# Patient Record
Sex: Male | Born: 1965 | Race: White | Hispanic: No | Smoking: Never smoker
Health system: Southern US, Community
[De-identification: ages and names within clinical notes are randomized; demographics above are authoritative.]

## PROBLEM LIST (undated history)

## (undated) DIAGNOSIS — I10 Essential (primary) hypertension: Secondary | ICD-10-CM

## (undated) DIAGNOSIS — R079 Chest pain, unspecified: Secondary | ICD-10-CM

## (undated) DIAGNOSIS — N200 Calculus of kidney: Secondary | ICD-10-CM

## (undated) DIAGNOSIS — C801 Malignant (primary) neoplasm, unspecified: Secondary | ICD-10-CM

## (undated) DIAGNOSIS — K5792 Diverticulitis of intestine, part unspecified, without perforation or abscess without bleeding: Secondary | ICD-10-CM

## (undated) DIAGNOSIS — K219 Gastro-esophageal reflux disease without esophagitis: Secondary | ICD-10-CM

## (undated) HISTORY — PX: OTHER SURGICAL HISTORY: SHX169

## (undated) HISTORY — PX: KNEE ARTHROSCOPY: SHX127

---

## 2006-09-08 DIAGNOSIS — C801 Malignant (primary) neoplasm, unspecified: Secondary | ICD-10-CM

## 2006-09-08 HISTORY — DX: Malignant (primary) neoplasm, unspecified: C80.1

## 2011-04-11 ENCOUNTER — Encounter: Payer: Self-pay | Admitting: *Deleted

## 2011-04-11 ENCOUNTER — Emergency Department (HOSPITAL_BASED_OUTPATIENT_CLINIC_OR_DEPARTMENT_OTHER)
Admission: EM | Admit: 2011-04-11 | Discharge: 2011-04-11 | Disposition: A | Discharge status: Home / Self Care | Payer: Worker's Compensation | Attending: Emergency Medicine | Admitting: Emergency Medicine

## 2011-04-11 DIAGNOSIS — X131XXA Other contact with steam and other hot vapors, initial encounter: Secondary | ICD-10-CM | POA: Insufficient documentation

## 2011-04-11 DIAGNOSIS — Y93G3 Activity, cooking and baking: Secondary | ICD-10-CM | POA: Insufficient documentation

## 2011-04-11 DIAGNOSIS — T23119A Burn of first degree of unspecified thumb (nail), initial encounter: Secondary | ICD-10-CM | POA: Insufficient documentation

## 2011-04-11 DIAGNOSIS — T300 Burn of unspecified body region, unspecified degree: Secondary | ICD-10-CM

## 2011-04-11 DIAGNOSIS — X12XXXA Contact with other hot fluids, initial encounter: Secondary | ICD-10-CM | POA: Insufficient documentation

## 2011-04-11 DIAGNOSIS — T22119A Burn of first degree of unspecified forearm, initial encounter: Secondary | ICD-10-CM | POA: Insufficient documentation

## 2011-04-11 MED ORDER — SILVER SULFADIAZINE 1 % EX CREA
TOPICAL_CREAM | Freq: Once | CUTANEOUS | Status: AC
  Administered 2011-04-11: 19:00:00 via TOPICAL

## 2011-04-11 MED ORDER — HYDROCODONE-ACETAMINOPHEN 5-325 MG PO TABS: 2.0000 | ORAL_TABLET | ORAL | Status: AC | PRN

## 2011-04-11 MED ORDER — OXYCODONE-ACETAMINOPHEN 5-325 MG PO TABS
2.0000 | ORAL_TABLET | Freq: Once | ORAL | Status: AC
  Administered 2011-04-11: 2 via ORAL
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
## 2011-11-29 ENCOUNTER — Encounter (HOSPITAL_BASED_OUTPATIENT_CLINIC_OR_DEPARTMENT_OTHER): Payer: Self-pay | Admitting: *Deleted

## 2011-11-29 ENCOUNTER — Emergency Department (HOSPITAL_BASED_OUTPATIENT_CLINIC_OR_DEPARTMENT_OTHER)
Admission: EM | Admit: 2011-11-29 | Discharge: 2011-11-29 | Disposition: A | Discharge status: Home / Self Care | Payer: Self-pay | Attending: Emergency Medicine | Admitting: Emergency Medicine

## 2011-11-29 DIAGNOSIS — M79606 Pain in leg, unspecified: Secondary | ICD-10-CM

## 2011-11-29 DIAGNOSIS — M79609 Pain in unspecified limb: Secondary | ICD-10-CM | POA: Insufficient documentation

## 2011-11-29 DIAGNOSIS — I8390 Asymptomatic varicose veins of unspecified lower extremity: Secondary | ICD-10-CM | POA: Insufficient documentation

## 2011-11-29 MED ORDER — ENOXAPARIN SODIUM 100 MG/ML ~~LOC~~ SOLN
1.0000 mg/kg | Freq: Once | SUBCUTANEOUS | Status: AC
  Administered 2011-11-29: 100 mg via SUBCUTANEOUS
  Filled 2011-11-29: qty 1

## 2011-11-29 MED ORDER — HYDROCODONE-ACETAMINOPHEN 5-500 MG PO TABS: 1.0000 | ORAL_TABLET | Freq: Four times a day (QID) | ORAL | Status: AC | PRN

## 2011-11-29 NOTE — Discharge Instructions (Signed)
Pain of Unknown Etiology (Pain Without a Known Cause) You have come to your caregiver because of pain. Pain can occur in any part of the body. Often there is not a definite cause. If your laboratory (blood or urine) work was normal and x-rays or other studies were normal, your caregiver may treat you without knowing the cause of the pain. An example of this is the headache. Most headaches are diagnosed by taking a history. This means your caregiver asks you questions about your headaches. Your caregiver determines a treatment based on your answers. Usually testing done for headaches is normal. Often testing is not done unless there is no response to medications. Regardless of where your pain is located today, you can be given medications to make you comfortable. If no physical cause of pain can be found, most cases of pain will gradually leave as suddenly as they came.  If you have a painful condition and no reason can be found for the pain, It is importantthat you follow up with your caregiver. If the pain becomes worse or does not go away, it may be necessary to repeat tests and look further for a possible cause.  Only take over-the-counter or prescription medicines for pain, discomfort, or fever as directed by your caregiver.   For the protection of your privacy, test results can not be given over the phone. Make sure you receive the results of your test. Ask as to how these results are to be obtained if you have not been informed. It is your responsibility to obtain your test results.   You may continue all activities unless the activities cause more pain. When the pain lessens, it is important to gradually resume normal activities. Resume activities by beginning slowly and gradually increasing the intensity and duration of the activities or exercise. During periods of severe pain, bed-rest may be helpful. Lay or sit in any position that is comfortable.   Ice used for acute (sudden) conditions may be  effective. Use a large plastic bag filled with ice and wrapped in a towel. This may provide pain relief.   See your caregiver for continued problems. They can help or refer you for exercises or physical therapy if necessary.  If you were given medications for your condition, do not drive, operate machinery or power tools, or sign legal documents for 24 hours. Do not drink alcohol, take sleeping pills, or take other medications that may interfere with treatment. See your caregiver immediately if you have pain that is becoming worse and not relieved by medications. Document Released: 05/20/2001 Document Revised: 08/14/2011 Document Reviewed: 08/25/2005 [employer] Patient Information 2012 [employer]. 

## 2011-11-29 NOTE — ED Notes (Signed)
Pt states he has had extreme left thigh and knee pain for 2 days. No known injury. Denies other s/s. Some small area of bruising noted.

## 2011-11-29 NOTE — ED Provider Notes (Signed)
History     CSN: [id number]  Arrival date & time 11/29/11  1509   First MD Initiated Contact with Patient 11/29/11 1614      Chief Complaint  Patient presents with  . Leg Pain    (Consider location/radiation/quality/duration/timing/severity/associated sxs/prior treatment) HPI Comments: Pt states that he noticed swelling and discoloration to the left thigh:no known injury  Patient is a 46 y.o. male presenting with leg pain. The history is provided by the patient. No language interpreter was used.  Leg Pain  The incident occurred 2 days ago. There was no injury mechanism. The pain is present in the left thigh. The quality of the pain is described as aching. The pain is moderate. The pain has been constant since onset. Pertinent negatives include no numbness, no inability to bear weight, no muscle weakness and no tingling. He reports no foreign bodies present. The symptoms are aggravated by palpation and bearing weight. He has tried nothing for the symptoms.    History reviewed. No pertinent past medical history.  Past Surgical History  Procedure Date  . Testicular cancer     History reviewed. No pertinent family history.  History  Substance Use Topics  . Smoking status: Never Smoker   . Smokeless tobacco: Not on file  . Alcohol Use: No      Review of Systems  Constitutional: Negative.   Respiratory: Negative.   Cardiovascular: Negative.   Skin: Negative.   Neurological: Negative for tingling and numbness.    Allergies  Flexeril  Home Medications   Current Outpatient Rx  Name Route Sig Dispense Refill  . OMEGA-3 FATTY ACIDS 1000 MG PO CAPS Oral Take 3 g by mouth daily.    Marland Kitchen HYDROCODONE-ACETAMINOPHEN 5-500 MG PO TABS Oral Take 1-2 tablets by mouth every 6 (six) hours as needed for pain. 10 tablet 0    BP 159/87  Pulse 96  Temp(Src) 98.3 F (36.8 C) (Oral)  Resp 20  Ht 5\' 10"  (1.778 m)  Wt 215 lb (97.523 kg)  BMI 30.85 kg/m2  SpO2 98%  Physical Exam    Nursing note and vitals reviewed. Constitutional: He is oriented to person, place, and time. He appears well-developed and well-nourished.  HENT:  Head: Normocephalic and atraumatic.  Cardiovascular: Normal rate and regular rhythm.   Pulmonary/Chest: Effort normal and breath sounds normal.  Musculoskeletal: Normal range of motion.       Pulses intact:pt has generalized swelling noted to the left thigh:pt has full WUJ:WJXB bruising noted to the area:pt has varicosities without redness noted  Neurological: He is alert and oriented to person, place, and time.  Skin: Skin is warm and dry.  Psychiatric: He has a normal mood and affect.    ED Course  Procedures (including critical care time)  Labs Reviewed - No data to display No results found.   1. Leg pain       MDM  Pt given lovenox here and set up for ultrasound in the morning at [facility]:pt given something for pain       Teressa Lower, NP 11/29/11 1752

## 2011-11-30 ENCOUNTER — Ambulatory Visit (HOSPITAL_COMMUNITY)
Admission: RE | Admit: 2011-11-30 | Discharge: 2011-11-30 | Disposition: A | Discharge status: Home / Self Care | Payer: Self-pay | Source: Ambulatory Visit | Attending: Vascular Surgery | Admitting: Vascular Surgery

## 2011-11-30 ENCOUNTER — Ambulatory Visit (HOSPITAL_BASED_OUTPATIENT_CLINIC_OR_DEPARTMENT_OTHER): Admission: RE | Admit: 2011-11-30 | Payer: Self-pay | Source: Ambulatory Visit

## 2011-11-30 ENCOUNTER — Ambulatory Visit (HOSPITAL_COMMUNITY)
Admission: RE | Admit: 2011-11-30 | Discharge: 2011-11-30 | Disposition: A | Discharge status: Home / Self Care | Payer: Worker's Compensation | Source: Ambulatory Visit

## 2011-11-30 DIAGNOSIS — R52 Pain, unspecified: Secondary | ICD-10-CM

## 2011-11-30 DIAGNOSIS — M79609 Pain in unspecified limb: Secondary | ICD-10-CM | POA: Insufficient documentation

## 2011-11-30 NOTE — ED Provider Notes (Signed)
Medical screening examination/treatment/procedure(s) were performed by non-physician practitioner and as supervising physician I was immediately available for consultation/collaboration.  Niasha Devins T Jahmia Berrett, MD 11/30/11 2336 

## 2011-12-01 DIAGNOSIS — M79609 Pain in unspecified limb: Secondary | ICD-10-CM

## 2011-12-01 NOTE — Progress Notes (Signed)
VASCULAR LAB PRELIMINARY  PRELIMINARY  PRELIMINARY  PRELIMINARY  .vlcom completed.    Preliminary report:  Right:  No evidence of DVT, superficial thrombosis, or Baker's cyst.  Terance Hart, RVT 12/01/2011, 11:41 AM

## 2012-02-14 ENCOUNTER — Emergency Department (HOSPITAL_BASED_OUTPATIENT_CLINIC_OR_DEPARTMENT_OTHER): Payer: Self-pay

## 2012-02-14 ENCOUNTER — Inpatient Hospital Stay (HOSPITAL_BASED_OUTPATIENT_CLINIC_OR_DEPARTMENT_OTHER)
Admission: EM | Admit: 2012-02-14 | Discharge: 2012-02-16 | DRG: 392 | Disposition: A | Discharge status: Home / Self Care | Payer: Self-pay | Attending: Internal Medicine | Admitting: Internal Medicine

## 2012-02-14 ENCOUNTER — Encounter (HOSPITAL_BASED_OUTPATIENT_CLINIC_OR_DEPARTMENT_OTHER): Payer: Self-pay | Admitting: *Deleted

## 2012-02-14 DIAGNOSIS — K5900 Constipation, unspecified: Secondary | ICD-10-CM | POA: Diagnosis present

## 2012-02-14 DIAGNOSIS — K219 Gastro-esophageal reflux disease without esophagitis: Secondary | ICD-10-CM | POA: Diagnosis present

## 2012-02-14 DIAGNOSIS — Z8547 Personal history of malignant neoplasm of testis: Secondary | ICD-10-CM

## 2012-02-14 DIAGNOSIS — R109 Unspecified abdominal pain: Secondary | ICD-10-CM | POA: Diagnosis present

## 2012-02-14 DIAGNOSIS — I10 Essential (primary) hypertension: Secondary | ICD-10-CM | POA: Diagnosis present

## 2012-02-14 DIAGNOSIS — K5289 Other specified noninfective gastroenteritis and colitis: Principal | ICD-10-CM | POA: Diagnosis present

## 2012-02-14 DIAGNOSIS — K529 Noninfective gastroenteritis and colitis, unspecified: Secondary | ICD-10-CM | POA: Diagnosis present

## 2012-02-14 HISTORY — DX: Essential (primary) hypertension: I10

## 2012-02-14 HISTORY — DX: Gastro-esophageal reflux disease without esophagitis: K21.9

## 2012-02-14 LAB — BASIC METABOLIC PANEL
BUN: 23 mg/dL (ref 6–23)
CO2: 25 mEq/L (ref 19–32)
Chloride: 106 mEq/L (ref 96–112)
Glucose, Bld: 98 mg/dL (ref 70–99)
Potassium: 3.7 mEq/L (ref 3.5–5.1)

## 2012-02-14 LAB — CBC
HCT: 36.5 % — ABNORMAL LOW (ref 39.0–52.0)
Hemoglobin: 12.5 g/dL — ABNORMAL LOW (ref 13.0–17.0)
MCH: 30.7 pg (ref 26.0–34.0)
MCHC: 34.2 g/dL (ref 30.0–36.0)
MCV: 89.7 fL (ref 78.0–100.0)
RBC: 4.07 MIL/uL — ABNORMAL LOW (ref 4.22–5.81)

## 2012-02-14 LAB — DIFFERENTIAL
Basophils Relative: 1 % (ref 0–1)
Eosinophils Absolute: 0.4 10*3/uL (ref 0.0–0.7)
Lymphs Abs: 2.3 10*3/uL (ref 0.7–4.0)
Monocytes Absolute: 1 10*3/uL (ref 0.1–1.0)
Monocytes Relative: 12 % (ref 3–12)

## 2012-02-14 MED ORDER — SODIUM CHLORIDE 0.9 % IV SOLN
INTRAVENOUS | Status: DC
  Administered 2012-02-14 – 2012-02-15 (×2): via INTRAVENOUS

## 2012-02-14 MED ORDER — FENTANYL CITRATE 0.05 MG/ML IJ SOLN
50.0000 ug | Freq: Once | INTRAMUSCULAR | Status: AC
  Administered 2012-02-14: 50 ug via INTRAVENOUS
  Filled 2012-02-14: qty 2

## 2012-02-14 NOTE — ED Notes (Signed)
Pt states he has not had a BM in 4 days, but when he does try to go to the bathroom, it is "just like water"

## 2012-02-14 NOTE — ED Provider Notes (Signed)
History  This chart was scribed for Hanley Seamen, MD by Cherlynn Perches. The patient was seen in room MH12/MH12. Patient's care was started at 2052.   CSN: [id number]  Arrival date & time 02/14/12  2052   First MD Initiated Contact with Patient 02/14/12 2302      Chief Complaint  Patient presents with  . Constipation    (Consider location/radiation/quality/duration/timing/severity/associated sxs/prior treatment) HPI  Harry Arnold is a 46 y.o. male with a h/o testicular cancer who presents to the Emergency Department complaining of 4 days of gradual onset, constant, moderate to severe abdominal pain localized to the lower abdomen described as cramping with associated constipation and decreased appetite. Pt reports that he has not had a bowel movement in 4 days. Pt states that he was eating normally for the past 3 days, but has not eaten anything today. Pt reports that sometimes he gets the urge to have a bowel movement, but voids a clear liquid "like water". Pt reports taking Pepto Bismol last night with no relief. Pt denies dysuria, vomiting, and increased urination. Pt denies smoking and alcohol use.   History reviewed. No pertinent past medical history.  Past Surgical History  Procedure Date  . Testicular cancer     History reviewed. No pertinent family history.  History  Substance Use Topics  . Smoking status: Never Smoker   . Smokeless tobacco: Not on file  . Alcohol Use: No      Review of Systems  Constitutional: Positive for appetite change. Negative for fever and chills.  HENT: Negative for ear pain and neck pain.   Respiratory: Negative for cough and shortness of breath.   Cardiovascular: Negative for chest pain.  Gastrointestinal: Positive for abdominal pain and constipation. Negative for nausea, vomiting and diarrhea.  Genitourinary: Negative for dysuria and hematuria.  Neurological: Negative for light-headedness and headaches.  All other systems reviewed and  are negative.    Allergies  Flexeril  Home Medications   Current Outpatient Rx  Name Route Sig Dispense Refill  . PEPTO-BISMOL PO Oral Take 10 mLs by mouth daily as needed. Patient used this medication for an upset stomach.    . OMEGA-3 FATTY ACIDS 1000 MG PO CAPS Oral Take 3 g by mouth daily.      Triage Vitals: BP 130/85  Pulse 88  Temp(Src) 98.7 F (37.1 C) (Oral)  Resp 20  Ht 5\' 10"  (1.778 m)  Wt 215 lb (97.523 kg)  BMI 30.85 kg/m2  SpO2 99%  Physical Exam  Nursing note and vitals reviewed. Constitutional: He is oriented to person, place, and time. He appears well-developed and well-nourished.  HENT:  Head: Normocephalic and atraumatic.  Eyes: Conjunctivae and EOM are normal. No scleral icterus.  Neck: Normal range of motion. Neck supple.  Cardiovascular: Normal rate, regular rhythm and normal heart sounds.        Normal pulses  Pulmonary/Chest: Effort normal and breath sounds normal. No respiratory distress.  Abdominal: Soft. There is tenderness (diffuse).       High pitched bowel sounds  Genitourinary:       Normal sphincter tone, no stool involved  Musculoskeletal: He exhibits no edema and no tenderness.  Neurological: He is alert and oriented to person, place, and time.  Skin: Skin is warm and dry.  Psychiatric: He has a normal mood and affect. His behavior is normal.    ED Course  Procedures (including critical care time)  DIAGNOSTIC STUDIES: Oxygen Saturation is 99% on room air, normal  by my interpretation.    COORDINATION OF CARE: 11:12PM - Patient understands and agrees with initial ED impression and plan with expectations set for ED visit.     MDM   Nursing notes and vitals signs, including pulse oximetry, reviewed.  Summary of this visit's results, reviewed by myself:  Labs:  Results for orders placed during the hospital encounter of 02/14/12  BASIC METABOLIC PANEL      Component Value Range   Sodium 140  135 - 145 (mEq/L)   Potassium  3.7  3.5 - 5.1 (mEq/L)   Chloride 106  96 - 112 (mEq/L)   CO2 25  19 - 32 (mEq/L)   Glucose, Bld 98  70 - 99 (mg/dL)   BUN 23  6 - 23 (mg/dL)   Creatinine, Ser 8.29  0.50 - 1.35 (mg/dL)   Calcium 8.9  8.4 - 56.2 (mg/dL)   GFR calc non Af Amer 72 (*) >90 (mL/min)   GFR calc Af Amer 84 (*) >90 (mL/min)  CBC      Component Value Range   WBC 7.9  4.0 - 10.5 (K/uL)   RBC 4.07 (*) 4.22 - 5.81 (MIL/uL)   Hemoglobin 12.5 (*) 13.0 - 17.0 (g/dL)   HCT 13.0 (*) 86.5 - 52.0 (%)   MCV 89.7  78.0 - 100.0 (fL)   MCH 30.7  26.0 - 34.0 (pg)   MCHC 34.2  30.0 - 36.0 (g/dL)   RDW 78.4  69.6 - 29.5 (%)   Platelets 195  150 - 400 (K/uL)  DIFFERENTIAL      Component Value Range   Neutrophils Relative 53  43 - 77 (%)   Neutro Abs 4.2  1.7 - 7.7 (K/uL)   Lymphocytes Relative 29  12 - 46 (%)   Lymphs Abs 2.3  0.7 - 4.0 (K/uL)   Monocytes Relative 12  3 - 12 (%)   Monocytes Absolute 1.0  0.1 - 1.0 (K/uL)   Eosinophils Relative 5  0 - 5 (%)   Eosinophils Absolute 0.4  0.0 - 0.7 (K/uL)   Basophils Relative 1  0 - 1 (%)   Basophils Absolute 0.1  0.0 - 0.1 (K/uL)    Imaging Studies: Dg Abd 1 View  02/14/2012  *RADIOLOGY REPORT*  Clinical Data: Abdominal pain for 4 days.  ABDOMEN - 1 VIEW  Comparison: None.  Findings: The bowel gas pattern is non-obstructive. 3 mm calcific density projecting over the left renal shadow.  Otherwise, organ outlines are normal where seen. No acute or aggressive osseous abnormality identified. Mild leftward curvature of the lower thoracic spine, may be positional. Lung bases are predominately clear, with mild retrocardiac linear opacity.  IMPRESSION: Nonobstructive bowel gas pattern.  3 mm calcific density projecting over the left renal shadow may represent a renal stone.  Original Report Authenticated By: Waneta Martins, M.D.   Ct Abdomen Pelvis W Contrast  02/15/2012  *RADIOLOGY REPORT*  Clinical Data: Abdominal pain, constipation, history of testicular cancer  CT ABDOMEN AND  PELVIS WITH CONTRAST  Technique:  Multidetector CT imaging of the abdomen and pelvis was performed following the standard protocol during bolus administration of intravenous contrast.  Contrast: 40mL OMNIPAQUE IOHEXOL 300 MG/ML  SOLN, OMNIPAQUE IOHEXOL 300 MG/ML  SOLN  Comparison: None.  Findings: .  Liver, spleen, pancreas, and adrenal glands within normal limits.  Gallbladder unremarkable.  No intrahepatic or extrahepatic ductal dilatation.  3 mm nonobstructing interpolar left renal calculus (series 2/image 35).  Right kidney is within  normal limits.  No hydronephrosis.  No evidence of bowel obstruction.  Normal appendix.  Scattered sigmoid diverticuli without associated inflammatory changes.  Mild sigmoid wall thickening with associated mucosal hyperenhancement, possibly reflecting mild infectious/inflammatory colitis, equivocal.  No evidence of abdominal aortic aneurysm.  No abdominopelvic ascites.  Small retroperitoneal lymph nodes which do not meet pathologic CT size criteria.  Prostate is unremarkable.  Bladder is within normal limits.  Mild degenerative changes of the visualized thoracolumbar spine.  IMPRESSION: Possible mild infectious/inflammatory sigmoid colitis, equivocal.  No evidence of bowel obstruction.  Normal appendix.  3 mm nonobstructing left renal calculus.  No hydronephrosis.  Original Report Authenticated By: Charline Bills, M.D.          I personally performed the services described in this documentation, which was scribed in my presence.  The recorded information has been reviewed and considered.    Hanley Seamen, MD 02/15/12 [phone]

## 2012-02-15 ENCOUNTER — Encounter (HOSPITAL_COMMUNITY): Payer: Self-pay | Admitting: *Deleted

## 2012-02-15 DIAGNOSIS — K529 Noninfective gastroenteritis and colitis, unspecified: Secondary | ICD-10-CM

## 2012-02-15 DIAGNOSIS — R109 Unspecified abdominal pain: Secondary | ICD-10-CM | POA: Diagnosis present

## 2012-02-15 LAB — CBC
Hemoglobin: 12.5 g/dL — ABNORMAL LOW (ref 13.0–17.0)
MCH: 30 pg (ref 26.0–34.0)
MCHC: 33.5 g/dL (ref 30.0–36.0)

## 2012-02-15 LAB — BASIC METABOLIC PANEL
BUN: 17 mg/dL (ref 6–23)
Calcium: 8.1 mg/dL — ABNORMAL LOW (ref 8.4–10.5)
Creatinine, Ser: 1 mg/dL (ref 0.50–1.35)
GFR calc non Af Amer: 90 mL/min — ABNORMAL LOW (ref >90)
Glucose, Bld: 93 mg/dL (ref 70–99)

## 2012-02-15 MED ORDER — SODIUM CHLORIDE 0.9 % IV SOLN: INTRAVENOUS | Status: DC

## 2012-02-15 MED ORDER — PANTOPRAZOLE SODIUM 40 MG PO TBEC
40.0000 mg | DELAYED_RELEASE_TABLET | Freq: Every day | ORAL | Status: DC
  Administered 2012-02-15 – 2012-02-16 (×2): 40 mg via ORAL
  Filled 2012-02-15 (×2): qty 1

## 2012-02-15 MED ORDER — HYDROCODONE-ACETAMINOPHEN 5-325 MG PO TABS
1.0000 | ORAL_TABLET | ORAL | Status: DC | PRN
  Administered 2012-02-15 – 2012-02-16 (×3): 2 via ORAL
  Filled 2012-02-15 (×3): qty 2

## 2012-02-15 MED ORDER — ONDANSETRON HCL 4 MG/2ML IJ SOLN: 4.0000 mg | Freq: Three times a day (TID) | INTRAMUSCULAR | Status: DC | PRN

## 2012-02-15 MED ORDER — CIPROFLOXACIN IN D5W 400 MG/200ML IV SOLN
400.0000 mg | Freq: Two times a day (BID) | INTRAVENOUS | Status: DC
  Administered 2012-02-15 – 2012-02-16 (×3): 400 mg via INTRAVENOUS
  Filled 2012-02-15 (×5): qty 200

## 2012-02-15 MED ORDER — ONDANSETRON HCL 4 MG/2ML IJ SOLN: 4.0000 mg | Freq: Four times a day (QID) | INTRAMUSCULAR | Status: DC | PRN

## 2012-02-15 MED ORDER — METRONIDAZOLE IN NACL 5-0.79 MG/ML-% IV SOLN
500.0000 mg | Freq: Three times a day (TID) | INTRAVENOUS | Status: DC
  Administered 2012-02-15 – 2012-02-16 (×4): 500 mg via INTRAVENOUS
  Filled 2012-02-15 (×7): qty 100

## 2012-02-15 MED ORDER — POTASSIUM CHLORIDE IN NACL 20-0.9 MEQ/L-% IV SOLN
INTRAVENOUS | Status: AC
  Administered 2012-02-15: 06:00:00 via INTRAVENOUS
  Filled 2012-02-15 (×2): qty 1000

## 2012-02-15 MED ORDER — IOHEXOL 300 MG/ML  SOLN
40.0000 mL | Freq: Once | INTRAMUSCULAR | Status: AC | PRN
  Administered 2012-02-14: 40 mL via ORAL

## 2012-02-15 MED ORDER — ONDANSETRON HCL 4 MG PO TABS: 4.0000 mg | ORAL_TABLET | Freq: Four times a day (QID) | ORAL | Status: DC | PRN

## 2012-02-15 MED ORDER — IOHEXOL 300 MG/ML  SOLN
100.0000 mL | Freq: Once | INTRAMUSCULAR | Status: AC | PRN
  Administered 2012-02-15: 100 mL via INTRAVENOUS

## 2012-02-15 NOTE — H&P (Signed)
Chief Complaint:  Abd pain  HPI: 46 yo male with 3 days of worsening bilateral lower abd pain with diarrhea nonbloody.  No n/v/fevers.  tol po.  H/o testicular can in 2008 s/p surgical no chemo or radiation.  Denies dysuria or hematuria.  Pt seen at urgent care and transferred here for colitis on ct abd/pelvis however no abx given yet.  Review of Systems:  O/w neg  Past Medical History: Past Medical History  Diagnosis Date  . Hypertension   . GERD (gastroesophageal reflux disease)    Past Surgical History  Procedure Date  . Testicular cancer     Medications: Prior to Admission medications   Medication Sig Start Date End Date Taking? Authorizing Provider  Bismuth Subsalicylate (PEPTO-BISMOL PO) Take 10 mLs by mouth daily as needed. Patient used this medication for an upset stomach.   Yes Historical Provider, MD  fish oil-omega-3 fatty acids 1000 MG capsule Take 3 g by mouth daily.   Yes Historical Provider, MD    Allergies:   Allergies  Allergen Reactions  . Flexeril (Cyclobenzaprine Hcl) Rash    Social History:  reports that he has never smoked. He does not have any smokeless tobacco history on file. He reports that he does not drink alcohol or use illicit drugs.  Family History: History reviewed. No pertinent family history.  Physical Exam: Filed Vitals:   02/14/12 2108 02/15/12 0133 02/15/12 0346  BP: 130/85 135/75 146/90  Pulse: 88 75 74  Temp: 98.7 F (37.1 C) 98.4 F (36.9 C) 98 F (36.7 C)  TempSrc: Oral Oral Oral  Resp: 20  18  Height: 5\' 10"  (1.778 m)    Weight: 97.523 kg (215 lb)    SpO2: 99% 99% 98%   General appearance: alert, cooperative and no distress Lungs: clear to auscultation bilaterally Heart: regular rate and rhythm, S1, S2 normal, no murmur, click, rub or gallop Abdomen: soft, non-tender; bowel sounds normal; no masses,  no organomegaly Extremities: extremities normal, atraumatic, no cyanosis or edema Pulses: 2+ and symmetric Skin:  Skin color, texture, turgor normal. No rashes or lesions Neurologic: Grossly normal    Labs on Admission:   [facility] 02/14/12 2320  NA 140  K 3.7  CL 106  CO2 25  GLUCOSE 98  BUN 23  CREATININE 1.20  CALCIUM 8.9  MG --  PHOS --    Basename 02/14/12 2320  WBC 7.9  NEUTROABS 4.2  HGB 12.5*  HCT 36.5*  MCV 89.7  PLT 195   Radiological Exams on Admission: Dg Abd 1 View  02/14/2012  *RADIOLOGY REPORT*  Clinical Data: Abdominal pain for 4 days.  ABDOMEN - 1 VIEW  Comparison: None.  Findings: The bowel gas pattern is non-obstructive. 3 mm calcific density projecting over the left renal shadow.  Otherwise, organ outlines are normal where seen. No acute or aggressive osseous abnormality identified. Mild leftward curvature of the lower thoracic spine, may be positional. Lung bases are predominately clear, with mild retrocardiac linear opacity.  IMPRESSION: Nonobstructive bowel gas pattern.  3 mm calcific density projecting over the left renal shadow may represent a renal stone.  Original Report Authenticated By: Waneta Martins, M.D.   Ct Abdomen Pelvis W Contrast  02/15/2012  *RADIOLOGY REPORT*  Clinical Data: Abdominal pain, constipation, history of testicular cancer  CT ABDOMEN AND PELVIS WITH CONTRAST  Technique:  Multidetector CT imaging of the abdomen and pelvis was performed following the standard protocol during bolus administration of intravenous contrast.  Contrast: 40mL OMNIPAQUE  IOHEXOL 300 MG/ML  SOLN, OMNIPAQUE IOHEXOL 300 MG/ML  SOLN  Comparison: None.  Findings: .  Liver, spleen, pancreas, and adrenal glands within normal limits.  Gallbladder unremarkable.  No intrahepatic or extrahepatic ductal dilatation.  3 mm nonobstructing interpolar left renal calculus (series 2/image 35).  Right kidney is within normal limits.  No hydronephrosis.  No evidence of bowel obstruction.  Normal appendix.  Scattered sigmoid diverticuli without associated inflammatory changes.  Mild  sigmoid wall thickening with associated mucosal hyperenhancement, possibly reflecting mild infectious/inflammatory colitis, equivocal.  No evidence of abdominal aortic aneurysm.  No abdominopelvic ascites.  Small retroperitoneal lymph nodes which do not meet pathologic CT size criteria.  Prostate is unremarkable.  Bladder is within normal limits.  Mild degenerative changes of the visualized thoracolumbar spine.  IMPRESSION: Possible mild infectious/inflammatory sigmoid colitis, equivocal.  No evidence of bowel obstruction.  Normal appendix.  3 mm nonobstructing left renal calculus.  No hydronephrosis.  Original Report Authenticated By: Charline Bills, M.D.    Assessment/Plan Present on Admission:  46 yo male with acute colitis .Colitis .Abdominal pain, acute  Iv flagyl and cipro.  zofran and pain meds.  Can likely go to po meds in next 24 hours.  Benign abd exam.  Ivf, full liquid diet.  Kelvis Berger [phone] 02/15/2012, 4:10 AM

## 2012-02-15 NOTE — Progress Notes (Signed)
Subjective: No nausea or vomiting; still with abdominal discomfort (lower quadrants) and diarrhea.  Objective: Vital signs in last 24 hours: Temp:  [97.7 F (36.5 C)-98.7 F (37.1 C)] 97.7 F (36.5 C) (06/09 0610) Pulse Rate:  [71-88] 71  (06/09 0610) Resp:  [18-20] 18  (06/09 0610) BP: (130-149)/(69-90) 149/69 mmHg (06/09 0610) SpO2:  [98 %-99 %] 99 % (06/09 0610) Weight:  [97.523 kg (215 lb)] 97.523 kg (215 lb) (06/08 2108) Weight change:  Last BM Date: 02/15/12  Intake/Output from previous day: 06/08 0701 - 06/09 0700 In: -  Out: 2 [Urine:1; Stool:1]     Physical Exam: General: Alert, awake, oriented x3, in no distress. HEENT: No bruits, no goiter. Heart: Regular rate and rhythm, without murmurs, rubs, gallops. Lungs: Clear to auscultation bilaterally. Abdomen: Soft, bilaterally lower quadrant tenderness, nondistended, positive bowel sounds. Extremities: No clubbing cyanosis or edema with positive pedal pulses. Neuro: Grossly intact, nonfocal.  Lab Results: Basic Metabolic Panel:  Basename 02/15/12 0620 02/14/12 2320  NA 140 140  K 3.6 3.7  CL 108 106  CO2 24 25  GLUCOSE 93 98  BUN 17 23  CREATININE 1.00 1.20  CALCIUM 8.1* 8.9  MG -- --  PHOS -- --   CBC:  Basename 02/15/12 0620 02/14/12 2320  WBC 7.3 7.9  NEUTROABS -- 4.2  HGB 12.5* 12.5*  HCT 37.3* 36.5*  MCV 89.4 89.7  PLT 177 195     Studies/Results: Dg Abd 1 View  02/14/2012  *RADIOLOGY REPORT*  Clinical Data: Abdominal pain for 4 days.  ABDOMEN - 1 VIEW  Comparison: None.  Findings: The bowel gas pattern is non-obstructive. 3 mm calcific density projecting over the left renal shadow.  Otherwise, organ outlines are normal where seen. No acute or aggressive osseous abnormality identified. Mild leftward curvature of the lower thoracic spine, may be positional. Lung bases are predominately clear, with mild retrocardiac linear opacity.  IMPRESSION: Nonobstructive bowel gas pattern.  3 mm calcific  density projecting over the left renal shadow may represent a renal stone.  Original Report Authenticated By: Waneta Martins, M.D.   Ct Abdomen Pelvis W Contrast  02/15/2012  *RADIOLOGY REPORT*  Clinical Data: Abdominal pain, constipation, history of testicular cancer  CT ABDOMEN AND PELVIS WITH CONTRAST  Technique:  Multidetector CT imaging of the abdomen and pelvis was performed following the standard protocol during bolus administration of intravenous contrast.  Contrast: 40mL OMNIPAQUE IOHEXOL 300 MG/ML  SOLN, OMNIPAQUE IOHEXOL 300 MG/ML  SOLN  Comparison: None.  Findings: .  Liver, spleen, pancreas, and adrenal glands within normal limits.  Gallbladder unremarkable.  No intrahepatic or extrahepatic ductal dilatation.  3 mm nonobstructing interpolar left renal calculus (series 2/image 35).  Right kidney is within normal limits.  No hydronephrosis.  No evidence of bowel obstruction.  Normal appendix.  Scattered sigmoid diverticuli without associated inflammatory changes.  Mild sigmoid wall thickening with associated mucosal hyperenhancement, possibly reflecting mild infectious/inflammatory colitis, equivocal.  No evidence of abdominal aortic aneurysm.  No abdominopelvic ascites.  Small retroperitoneal lymph nodes which do not meet pathologic CT size criteria.  Prostate is unremarkable.  Bladder is within normal limits.  Mild degenerative changes of the visualized thoracolumbar spine.  IMPRESSION: Possible mild infectious/inflammatory sigmoid colitis, equivocal.  No evidence of bowel obstruction.  Normal appendix.  3 mm nonobstructing left renal calculus.  No hydronephrosis.  Original Report Authenticated By: Charline Bills, M.D.    Medications: Scheduled Meds:   . ciprofloxacin  400 mg Intravenous Q12H  .  fentaNYL  50 mcg Intravenous Once  . metronidazole  500 mg Intravenous Q8H  . DISCONTD: sodium chloride   Intravenous STAT   Continuous Infusions:   . 0.9 % NaCl with KCl 20 mEq / L  100 mL/hr at 02/15/12 0540  . DISCONTD: sodium chloride 125 mL/hr at 02/15/12 0142   PRN Meds:.HYDROcodone-acetaminophen, iohexol, iohexol, ondansetron (ZOFRAN) IV, ondansetron, DISCONTD: ondansetron (ZOFRAN) IV  Assessment/Plan: 1-Colitis presumed infectious: continue supportive care, IVF's and antibiotics. Will advance diet as tolerated.   2-Abdominal pain, acute: due to colitis. Treatment as mentioned above.  3-GERD: continue protonix  4-DVT: SCD's while on bed and early ambulation.    LOS: 1 day   Harry Arnold [facility] [phone]  02/15/2012, 11:35 AM

## 2012-02-15 NOTE — ED Notes (Signed)
Report given to [facility] , Jeff,RN

## 2012-02-15 NOTE — ED Notes (Signed)
Report called to receiving nurse on 5100. Rm [phone].

## 2012-02-16 DIAGNOSIS — K529 Noninfective gastroenteritis and colitis, unspecified: Secondary | ICD-10-CM

## 2012-02-16 LAB — BASIC METABOLIC PANEL
Calcium: 8.5 mg/dL (ref 8.4–10.5)
Chloride: 107 mEq/L (ref 96–112)
Creatinine, Ser: 1.04 mg/dL (ref 0.50–1.35)
GFR calc Af Amer: >90 mL/min (ref >90)

## 2012-02-16 LAB — CBC
MCV: 90.4 fL (ref 78.0–100.0)
Platelets: 170 10*3/uL (ref 150–400)
RDW: 13.2 % (ref 11.5–15.5)
WBC: 6.5 10*3/uL (ref 4.0–10.5)

## 2012-02-16 MED ORDER — LANSOPRAZOLE 30 MG PO CPDR: 30.0000 mg | DELAYED_RELEASE_CAPSULE | Freq: Every day | ORAL | Status: DC

## 2012-02-16 MED ORDER — METRONIDAZOLE 500 MG PO TABS: 500.0000 mg | ORAL_TABLET | Freq: Three times a day (TID) | ORAL | Status: AC

## 2012-02-16 MED ORDER — TRAMADOL HCL 50 MG PO TABS: 50.0000 mg | ORAL_TABLET | Freq: Four times a day (QID) | ORAL | Status: AC | PRN

## 2012-02-16 MED ORDER — CIPROFLOXACIN HCL 500 MG PO TABS: 500.0000 mg | ORAL_TABLET | Freq: Two times a day (BID) | ORAL | Status: AC

## 2012-02-16 NOTE — Progress Notes (Signed)
Patient discharged to home with instructions and prescriptions, verbalized understanding, escorted by wife.

## 2012-02-16 NOTE — Discharge Summary (Signed)
Physician Discharge Summary  Patient ID: Branon Sabine MRN: [id number] DOB/AGE: [id number] y.o.  Admit date: 02/14/2012 Discharge date: 02/16/2012  Primary Care Physician:  No primary provider on file.   Discharge Diagnoses:   1-Colitis 2-Abdominal pain, acute 3-HTN 4-GERD  Medication List  As of 02/16/2012 10:48 AM   STOP taking these medications         PEPTO-BISMOL PO         TAKE these medications         ciprofloxacin 500 MG tablet   Commonly known as: CIPRO   Take 1 tablet (500 mg total) by mouth 2 (two) times daily.      fish oil-omega-3 fatty acids 1000 MG capsule   Take 3 g by mouth daily.      metroNIDAZOLE 500 MG tablet   Commonly known as: FLAGYL   Take 1 tablet (500 mg total) by mouth 3 (three) times daily.      traMADol 50 MG tablet   Commonly known as: ULTRAM   Take 1 tablet (50 mg total) by mouth every 6 (six) hours as needed for pain.             Disposition and Follow-up:  Patient discharge in stable and improved condition; currently tolerating full diet and without any nausea or vomiting. He has been instructed to follow a high fiber diet and to keep himself well hydrated; outpatient follow up with [facility] will be arrange for outpatient colonoscopy. Patient encourage to establish care with PCP to follow and continue treatment of his chronic medical problems.  Consults:   None   Significant Diagnostic Studies:  Ct Abdomen Pelvis W Contrast  02/15/2012  *RADIOLOGY REPORT*  Clinical Data: Abdominal pain, constipation, history of testicular cancer  CT ABDOMEN AND PELVIS WITH CONTRAST  Technique:  Multidetector CT imaging of the abdomen and pelvis was performed following the standard protocol during bolus administration of intravenous contrast.  Contrast: 40mL OMNIPAQUE IOHEXOL 300 MG/ML  SOLN, OMNIPAQUE IOHEXOL 300 MG/ML  SOLN  Comparison: None.  Findings: .  Liver, spleen, pancreas, and adrenal glands within normal limits.  Gallbladder  unremarkable.  No intrahepatic or extrahepatic ductal dilatation.  3 mm nonobstructing interpolar left renal calculus (series 2/image 35).  Right kidney is within normal limits.  No hydronephrosis.  No evidence of bowel obstruction.  Normal appendix.  Scattered sigmoid diverticuli without associated inflammatory changes.  Mild sigmoid wall thickening with associated mucosal hyperenhancement, possibly reflecting mild infectious/inflammatory colitis, equivocal.  No evidence of abdominal aortic aneurysm.  No abdominopelvic ascites.  Small retroperitoneal lymph nodes which do not meet pathologic CT size criteria.  Prostate is unremarkable.  Bladder is within normal limits.  Mild degenerative changes of the visualized thoracolumbar spine.  IMPRESSION: Possible mild infectious/inflammatory sigmoid colitis, equivocal.  No evidence of bowel obstruction.  Normal appendix.  3 mm nonobstructing left renal calculus.  No hydronephrosis.  Original Report Authenticated By: Charline Bills, M.D.    Brief H and P: For complete details please refer to admission H and P, but in brief 46 yo male with 3 days of worsening bilateral lower abd pain with diarrhea nonbloody. No n/v/fevers. tol po. H/o testicular can in 2008 s/p surgical no chemo or radiation. Denies dysuria or hematuria. Pt seen at urgent care and transferred here for colitis on ct abd/pelvis. Patient denies hematochezia, hematemesis and also fever or chills.   Hospital Course:  1-Colitis (presumed to be infectious in origin): at this point his abdominal pain  has improved and he is not complaining of nausea or vomiting. Plan is to continue PO flagyl and cipro for 8 more days and to arrange follow up with [facility] for outpatient colonoscopy in order to r/o any IBD or other condition associated with his abdominal discomfort.  2-Abdominal pain, acute: as mentioned above; will use PRN tramadol; encourage for high fiber diet and good hydration.  3-HTN: not taking any  meds at this point; BP well controlled during admission; will encourage heart healthy diet and to arrange follow up with a PCP for further treatment in the future if needed.  4-GERD: will continue PPI  Time spent on Discharge: 40 minutes  Signed: Amariz Flamenco 02/16/2012, 10:48 AM

## 2012-02-16 NOTE — Discharge Instructions (Signed)
Colitis Colitis is inflammation of the colon. Colitis can be a short-term or long-standing (chronic) illness. Crohn's disease and ulcerative colitis are 2 types of colitis which are chronic. They usually require lifelong treatment. CAUSES  There are many different causes of colitis, including:  Viruses.   Germs (bacteria).   Medicine reactions.  SYMPTOMS   Diarrhea.   Intestinal bleeding.   Pain.   Fever.   Throwing up (vomiting).   Tiredness (fatigue).   Weight loss.   Bowel blockage.  DIAGNOSIS  The diagnosis of colitis is based on examination and stool or blood tests. X-rays, CT scan, and colonoscopy may also be needed. TREATMENT  Treatment may include:  Fluids given through the vein (intravenously).   Bowel rest (nothing to eat or drink for a period of time).   Medicine for pain and diarrhea.   Medicines (antibiotics) that kill germs.   Cortisone medicines.   Surgery.  HOME CARE INSTRUCTIONS   Get plenty of rest.   Drink enough water and fluids to keep your urine clear or pale yellow.   Eat a well-balanced diet.   Call your caregiver for follow-up as recommended.  SEEK IMMEDIATE MEDICAL CARE IF:   You develop chills.   You have an oral temperature above 102 F (38.9 C), not controlled by medicine.   You have extreme weakness, fainting, or dehydration.   You have repeated vomiting.   You develop severe belly (abdominal) pain or are passing bloody or tarry stools.  MAKE SURE YOU:   Understand these instructions.   Will watch your condition.   Will get help right away if you are not doing well or get worse.     Diet for Diarrhea, Adult Having frequent, runny stools (diarrhea) has many causes. Diarrhea may be caused or worsened by food or drink. Diarrhea may be relieved by changing your diet. IF YOU ARE NOT TOLERATING SOLID FOODS:  Drink enough water and fluids to keep your urine clear or pale yellow.   Avoid sugary drinks and sodas as  well as milk-based beverages.   Avoid beverages containing caffeine and alcohol.   You may try rehydrating beverages. You can make your own by following this recipe:    tsp table salt.    tsp baking soda.   ? tsp salt substitute (potassium chloride).   1 tbs + 1 tsp sugar.   1 qt water.  As your stools become more solid, you can start eating solid foods. Add foods one at a time. If a certain food causes your diarrhea to get worse, avoid that food and try other foods. A low fiber, low-fat, and lactose-free diet is recommended. Small, frequent meals may be better tolerated.  Starches  Allowed:  White, [place], and pita breads, plain rolls, buns, bagels. Plain muffins, matzo. Soda, saltine, or graham crackers. Pretzels, melba toast, zwieback. Cooked cereals made with water: cornmeal, farina, cream cereals. Dry cereals: refined corn, wheat, rice. Potatoes prepared any way without skins, refined macaroni, spaghetti, noodles, refined rice.   Avoid:  Bread, rolls, or crackers made with whole wheat, multi-grains, rye, bran seeds, nuts, or coconut. Corn tortillas or taco shells. Cereals containing whole grains, multi-grains, bran, coconut, nuts, or raisins. Cooked or dry oatmeal. Coarse wheat cereals, granola. Cereals advertised as "high-fiber." Potato skins. Whole grain pasta, wild or brown rice. Popcorn. Sweet potatoes/yams. Sweet rolls, doughnuts, waffles, pancakes, sweet breads.  Vegetables  Allowed: Strained tomato and vegetable juices. Most well-cooked and canned vegetables without seeds. Fresh: Tender lettuce,  cucumber without the skin, cabbage, spinach, bean sprouts.   Avoid: Fresh, cooked, or canned: Artichokes, baked beans, beet greens, broccoli, Brussels sprouts, corn, kale, legumes, peas, sweet potatoes. Cooked: Green or red cabbage, spinach. Avoid large servings of any vegetables, because vegetables shrink when cooked, and they contain more fiber per serving than fresh vegetables.    Fruit  Allowed: All fruit juices except prune juice. Cooked or canned: Apricots, applesauce, cantaloupe, cherries, fruit cocktail, grapefruit, grapes, kiwi, mandarin oranges, peaches, pears, plums, watermelon. Fresh: Apples without skin, ripe banana, grapes, cantaloupe, cherries, grapefruit, peaches, oranges, plums. Keep servings limited to  cup or 1 piece.   Avoid: Fresh: Apple with skin, apricots, mango, pears, raspberries, strawberries. Prune juice, stewed or dried prunes. Dried fruits, raisins, dates. Large servings of all fresh fruits.  Meat and Meat Substitutes  Allowed: Ground or well-cooked tender beef, ham, veal, lamb, pork, or poultry. Eggs, plain cheese. Fish, oysters, shrimp, lobster, other seafoods. Liver, organ meats.   Avoid: Tough, fibrous meats with gristle. Peanut butter, smooth or chunky. Cheese, nuts, seeds, legumes, dried peas, beans, lentils.  Milk  Allowed: Yogurt, lactose-free milk, kefir, drinkable yogurt, buttermilk, soy milk.   Avoid: Milk, chocolate milk, beverages made with milk, such as milk shakes.  Soups  Allowed: Bouillon, broth, or soups made from allowed foods. Any strained soup.   Avoid: Soups made from vegetables that are not allowed, cream or milk-based soups.  Desserts and Sweets  Allowed: Sugar-free gelatin, sugar-free frozen ice pops made without sugar alcohol.   Avoid: Plain cakes and cookies, pie made with allowed fruit, pudding, custard, cream pie. Gelatin, fruit, ice, sherbet, frozen ice pops. Ice cream, ice milk without nuts. Plain hard candy, honey, jelly, molasses, syrup, sugar, chocolate syrup, gumdrops, marshmallows.  Fats and Oils  Allowed: Avoid any fats and oils.   Avoid: Seeds, nuts, olives, avocados. Margarine, butter, cream, mayonnaise, salad oils, plain salad dressings made from allowed foods. Plain gravy, crisp bacon without rind.  Beverages  Allowed: Water, decaffeinated teas, oral rehydration solutions, sugar-free  beverages.   Avoid: Fruit juices, caffeinated beverages (coffee, tea, soda or pop), alcohol, sports drinks, or lemon-lime soda or pop.  Condiments  Allowed: Ketchup, mustard, horseradish, vinegar, cream sauce, cheese sauce, cocoa powder. Spices in moderation: allspice, basil, bay leaves, celery powder or leaves, cinnamon, cumin powder, curry powder, ginger, mace, marjoram, onion or garlic powder, oregano, paprika, parsley flakes, ground pepper, rosemary, sage, savory, tarragon, thyme, turmeric.   Avoid: Coconut, honey.  Weight Monitoring: Weigh yourself every day. You should weigh yourself in the morning after you urinate and before you eat breakfast. Wear the same amount of clothing when you weigh yourself. Record your weight daily. Bring your recorded weights to your clinic visits. Tell your caregiver right away if you have gained 3 lb/1.4 kg or more in 1 day, 5 lb/2.3 kg in a week, or whatever amount you were told to report. SEEK IMMEDIATE MEDICAL CARE IF:   You are unable to keep fluids down.   You start to throw up (vomit) or diarrhea keeps coming back (persistent).   Abdominal pain develops, increases, or can be felt in one place (localizes).   You have an oral temperature above 102 F (38.9 C), not controlled by medicine.   Diarrhea contains blood or mucus.   You develop excessive weakness, dizziness, fainting, or extreme thirst.  MAKE SURE YOU:   Understand these instructions.   Will watch your condition.  Will get help right away if you are  not doing well or get worse. Diet for Diarrhea, Adult Having frequent, runny stools (diarrhea) has many causes. Diarrhea may be caused or worsened by food or drink. Diarrhea may be relieved by changing your diet. IF YOU ARE NOT TOLERATING SOLID FOODS:  Drink enough water and fluids to keep your urine clear or pale yellow.   Avoid sugary drinks and sodas as well as milk-based beverages.   Avoid beverages containing caffeine and alcohol.    You may try rehydrating beverages. You can make your own by following this recipe:    tsp table salt.    tsp baking soda.   ? tsp salt substitute (potassium chloride).   1 tbs + 1 tsp sugar.   1 qt water.  As your stools become more solid, you can start eating solid foods. Add foods one at a time. If a certain food causes your diarrhea to get worse, avoid that food and try other foods. A low fiber, low-fat, and lactose-free diet is recommended. Small, frequent meals may be better tolerated.  Starches  Allowed:  White, [place], and pita breads, plain rolls, buns, bagels. Plain muffins, matzo. Soda, saltine, or graham crackers. Pretzels, melba toast, zwieback. Cooked cereals made with water: cornmeal, farina, cream cereals. Dry cereals: refined corn, wheat, rice. Potatoes prepared any way without skins, refined macaroni, spaghetti, noodles, refined rice.   Avoid:  Bread, rolls, or crackers made with whole wheat, multi-grains, rye, bran seeds, nuts, or coconut. Corn tortillas or taco shells. Cereals containing whole grains, multi-grains, bran, coconut, nuts, or raisins. Cooked or dry oatmeal. Coarse wheat cereals, granola. Cereals advertised as "high-fiber." Potato skins. Whole grain pasta, wild or brown rice. Popcorn. Sweet potatoes/yams. Sweet rolls, doughnuts, waffles, pancakes, sweet breads.  Vegetables  Allowed: Strained tomato and vegetable juices. Most well-cooked and canned vegetables without seeds. Fresh: Tender lettuce, cucumber without the skin, cabbage, spinach, bean sprouts.   Avoid: Fresh, cooked, or canned: Artichokes, baked beans, beet greens, broccoli, Brussels sprouts, corn, kale, legumes, peas, sweet potatoes. Cooked: Green or red cabbage, spinach. Avoid large servings of any vegetables, because vegetables shrink when cooked, and they contain more fiber per serving than fresh vegetables.  Fruit  Allowed: All fruit juices except prune juice. Cooked or canned: Apricots,  applesauce, cantaloupe, cherries, fruit cocktail, grapefruit, grapes, kiwi, mandarin oranges, peaches, pears, plums, watermelon. Fresh: Apples without skin, ripe banana, grapes, cantaloupe, cherries, grapefruit, peaches, oranges, plums. Keep servings limited to  cup or 1 piece.   Avoid: Fresh: Apple with skin, apricots, mango, pears, raspberries, strawberries. Prune juice, stewed or dried prunes. Dried fruits, raisins, dates. Large servings of all fresh fruits.  Meat and Meat Substitutes  Allowed: Ground or well-cooked tender beef, ham, veal, lamb, pork, or poultry. Eggs, plain cheese. Fish, oysters, shrimp, lobster, other seafoods. Liver, organ meats.   Avoid: Tough, fibrous meats with gristle. Peanut butter, smooth or chunky. Cheese, nuts, seeds, legumes, dried peas, beans, lentils.  Milk  Allowed: Yogurt, lactose-free milk, kefir, drinkable yogurt, buttermilk, soy milk.   Avoid: Milk, chocolate milk, beverages made with milk, such as milk shakes.  Soups  Allowed: Bouillon, broth, or soups made from allowed foods. Any strained soup.   Avoid: Soups made from vegetables that are not allowed, cream or milk-based soups.  Desserts and Sweets  Allowed: Sugar-free gelatin, sugar-free frozen ice pops made without sugar alcohol.   Avoid: Plain cakes and cookies, pie made with allowed fruit, pudding, custard, cream pie. Gelatin, fruit, ice, sherbet, frozen ice pops. Ice cream,  ice milk without nuts. Plain hard candy, honey, jelly, molasses, syrup, sugar, chocolate syrup, gumdrops, marshmallows.  Fats and Oils  Allowed: Avoid any fats and oils.   Avoid: Seeds, nuts, olives, avocados. Margarine, butter, cream, mayonnaise, salad oils, plain salad dressings made from allowed foods. Plain gravy, crisp bacon without rind.  Beverages  Allowed: Water, decaffeinated teas, oral rehydration solutions, sugar-free beverages.   Avoid: Fruit juices, caffeinated beverages (coffee, tea, soda or pop),  alcohol, sports drinks, or lemon-lime soda or pop.  Condiments  Allowed: Ketchup, mustard, horseradish, vinegar, cream sauce, cheese sauce, cocoa powder. Spices in moderation: allspice, basil, bay leaves, celery powder or leaves, cinnamon, cumin powder, curry powder, ginger, mace, marjoram, onion or garlic powder, oregano, paprika, parsley flakes, ground pepper, rosemary, sage, savory, tarragon, thyme, turmeric.   Avoid: Coconut, honey.  Weight Monitoring: Weigh yourself every day. You should weigh yourself in the morning after you urinate and before you eat breakfast. Wear the same amount of clothing when you weigh yourself. Record your weight daily. Bring your recorded weights to your clinic visits. Tell your caregiver right away if you have gained 3 lb/1.4 kg or more in 1 day, 5 lb/2.3 kg in a week, or whatever amount you were told to report. SEEK IMMEDIATE MEDICAL CARE IF:   You are unable to keep fluids down.   You start to throw up (vomit) or diarrhea keeps coming back (persistent).   Abdominal pain develops, increases, or can be felt in one place (localizes).   You have an oral temperature above 102 F (38.9 C), not controlled by medicine.   Diarrhea contains blood or mucus.   You develop excessive weakness, dizziness, fainting, or extreme thirst.  MAKE SURE YOU:   Understand these instructions.   Will watch your condition.   Will get help right away if you are not doing well or get worse.  Document Released: 11/15/2003 Document Revised: 08/14/2011 Document Reviewed: 03/08/2009 [facility] Patient Information 2012 [place], [place].

## 2012-08-16 ENCOUNTER — Emergency Department (HOSPITAL_BASED_OUTPATIENT_CLINIC_OR_DEPARTMENT_OTHER)
Admission: EM | Admit: 2012-08-16 | Discharge: 2012-08-16 | Disposition: A | Discharge status: Home / Self Care | Payer: Worker's Compensation | Attending: Emergency Medicine | Admitting: Emergency Medicine

## 2012-08-16 ENCOUNTER — Emergency Department (HOSPITAL_BASED_OUTPATIENT_CLINIC_OR_DEPARTMENT_OTHER): Payer: Worker's Compensation

## 2012-08-16 ENCOUNTER — Encounter (HOSPITAL_BASED_OUTPATIENT_CLINIC_OR_DEPARTMENT_OTHER): Payer: Self-pay | Admitting: Emergency Medicine

## 2012-08-16 DIAGNOSIS — W010XXA Fall on same level from slipping, tripping and stumbling without subsequent striking against object, initial encounter: Secondary | ICD-10-CM | POA: Insufficient documentation

## 2012-08-16 DIAGNOSIS — K219 Gastro-esophageal reflux disease without esophagitis: Secondary | ICD-10-CM | POA: Insufficient documentation

## 2012-08-16 DIAGNOSIS — I10 Essential (primary) hypertension: Secondary | ICD-10-CM | POA: Insufficient documentation

## 2012-08-16 DIAGNOSIS — Y9389 Activity, other specified: Secondary | ICD-10-CM | POA: Insufficient documentation

## 2012-08-16 DIAGNOSIS — Z79899 Other long term (current) drug therapy: Secondary | ICD-10-CM | POA: Insufficient documentation

## 2012-08-16 DIAGNOSIS — S20219A Contusion of unspecified front wall of thorax, initial encounter: Secondary | ICD-10-CM | POA: Insufficient documentation

## 2012-08-16 DIAGNOSIS — Y9289 Other specified places as the place of occurrence of the external cause: Secondary | ICD-10-CM | POA: Insufficient documentation

## 2012-08-16 MED ORDER — HYDROCODONE-ACETAMINOPHEN 5-325 MG PO TABS
2.0000 | ORAL_TABLET | Freq: Once | ORAL | Status: AC
  Administered 2012-08-16: 2 via ORAL
  Filled 2012-08-16: qty 2

## 2012-08-16 MED ORDER — HYDROCODONE-ACETAMINOPHEN 5-325 MG PO TABS: 2.0000 | ORAL_TABLET | ORAL | Status: DC | PRN

## 2012-08-16 MED ORDER — IBUPROFEN 200 MG PO TABS
ORAL_TABLET | ORAL | Status: AC
  Administered 2012-08-16: 07:00:00
  Filled 2012-08-16: qty 1

## 2012-08-16 MED ORDER — IBUPROFEN 400 MG PO TABS
ORAL_TABLET | ORAL | Status: AC
  Administered 2012-08-16: 07:00:00
  Filled 2012-08-16: qty 1

## 2012-08-16 NOTE — ED Provider Notes (Signed)
History     CSN: [id number]  Arrival date & time 08/16/12  4098   First MD Initiated Contact with Patient 08/16/12 [phone]      Chief Complaint  Patient presents with  . Fall    slipped while carrying frozen food fall into metal rack  . Chest Pain    right rib area pain    HPI Patient was at work carrying some food when he slipped in fell striking his right rib cage on some metal racks.  His chief complaint is pain to the right rib/chest area.  No LOC or head injury.  No other complaints of pain or other injuries. Past Medical History  Diagnosis Date  . Hypertension   . GERD (gastroesophageal reflux disease)     Past Surgical History  Procedure Date  . Testicular cancer     History reviewed. No pertinent family history.  History  Substance Use Topics  . Smoking status: Never Smoker   . Smokeless tobacco: Not on file  . Alcohol Use: No      Review of Systems All other systems reviewed and are negative Allergies  Flexeril  Home Medications   Current Outpatient Rx  Name  Route  Sig  Dispense  Refill  . OMEGA-3 FATTY ACIDS 1000 MG PO CAPS   Oral   Take 3 g by mouth daily.         Marland Kitchen HYDROCODONE-ACETAMINOPHEN 5-325 MG PO TABS   Oral   Take 2 tablets by mouth every 4 (four) hours as needed for pain.   20 tablet   0   . LANSOPRAZOLE 30 MG PO CPDR   Oral   Take 1 capsule (30 mg total) by mouth daily.   30 capsule   1     BP 167/110  Pulse 77  Temp 98.1 F (36.7 C) (Oral)  Resp 20  SpO2 100%  Physical Exam  Nursing note and vitals reviewed. Constitutional: He is oriented to person, place, and time. He appears well-developed and well-nourished. No distress.  HENT:  Head: Normocephalic and atraumatic.  Eyes: Pupils are equal, round, and reactive to light.  Neck: Normal range of motion.  Cardiovascular: Normal rate and intact distal pulses.   Pulmonary/Chest: No respiratory distress.         Superficial abrasion noted on left chest wall    Abdominal: Normal appearance. He exhibits no distension.  Musculoskeletal: Normal range of motion.  Neurological: He is alert and oriented to person, place, and time. No cranial nerve deficit.  Skin: Skin is warm and dry. No rash noted.  Psychiatric: He has a normal mood and affect. His behavior is normal.    ED Course  Procedures (including critical care time)  Medications  HYDROcodone-acetaminophen (NORCO/VICODIN) 5-325 MG per tablet (not administered)  ibuprofen (ADVIL,MOTRIN) 400 MG tablet (   Given 08/16/12 0708)  ibuprofen (ADVIL,MOTRIN) 200 MG tablet (   Given 08/16/12 0708)    Labs Reviewed - No data to display Dg Chest 2 View  08/16/2012  *RADIOLOGY REPORT*  Clinical Data: Fall.  Right-sided chest pain and dyspnea.  CHEST - 2 VIEW  Comparison: None.  Findings: Low lung volumes are noted, however both lungs are clear. No evidence of pneumothorax or hemothorax.  Heart size is normal. No evidence of mediastinal widening or tracheal deviation.  Mild thoracic dextroscoliosis noted.  IMPRESSION: No active disease.   Original Report Authenticated By: Myles Rosenthal, M.D.      1. Chest wall contusion  MDM         Nelia Shi, MD 08/16/12 [phone]

## 2012-08-16 NOTE — ED Notes (Signed)
Pt reports that he fell while carrying frozen food at work striking Right rib area into metal racks denies LOC but c/o pain 10/10 denies current SOB

## 2012-09-13 ENCOUNTER — Emergency Department (HOSPITAL_BASED_OUTPATIENT_CLINIC_OR_DEPARTMENT_OTHER)
Admission: EM | Admit: 2012-09-13 | Discharge: 2012-09-13 | Disposition: A | Discharge status: Home / Self Care | Payer: Self-pay | Attending: Emergency Medicine | Admitting: Emergency Medicine

## 2012-09-13 ENCOUNTER — Encounter (HOSPITAL_BASED_OUTPATIENT_CLINIC_OR_DEPARTMENT_OTHER): Payer: Self-pay | Admitting: *Deleted

## 2012-09-13 ENCOUNTER — Emergency Department (HOSPITAL_BASED_OUTPATIENT_CLINIC_OR_DEPARTMENT_OTHER): Payer: Self-pay

## 2012-09-13 DIAGNOSIS — K219 Gastro-esophageal reflux disease without esophagitis: Secondary | ICD-10-CM | POA: Insufficient documentation

## 2012-09-13 DIAGNOSIS — I10 Essential (primary) hypertension: Secondary | ICD-10-CM | POA: Insufficient documentation

## 2012-09-13 DIAGNOSIS — Y939 Activity, unspecified: Secondary | ICD-10-CM | POA: Insufficient documentation

## 2012-09-13 DIAGNOSIS — W108XXA Fall (on) (from) other stairs and steps, initial encounter: Secondary | ICD-10-CM | POA: Insufficient documentation

## 2012-09-13 DIAGNOSIS — S63509A Unspecified sprain of unspecified wrist, initial encounter: Secondary | ICD-10-CM | POA: Insufficient documentation

## 2012-09-13 DIAGNOSIS — S63502A Unspecified sprain of left wrist, initial encounter: Secondary | ICD-10-CM

## 2012-09-13 DIAGNOSIS — Y929 Unspecified place or not applicable: Secondary | ICD-10-CM | POA: Insufficient documentation

## 2012-09-13 DIAGNOSIS — IMO0002 Reserved for concepts with insufficient information to code with codable children: Secondary | ICD-10-CM | POA: Insufficient documentation

## 2012-09-13 DIAGNOSIS — Z8547 Personal history of malignant neoplasm of testis: Secondary | ICD-10-CM | POA: Insufficient documentation

## 2012-09-13 DIAGNOSIS — Z79899 Other long term (current) drug therapy: Secondary | ICD-10-CM | POA: Insufficient documentation

## 2012-09-13 DIAGNOSIS — S46912A Strain of unspecified muscle, fascia and tendon at shoulder and upper arm level, left arm, initial encounter: Secondary | ICD-10-CM

## 2012-09-13 NOTE — ED Notes (Signed)
left arm pain. Fell off 2 stairs and fell onto his arm last night.

## 2012-09-13 NOTE — ED Provider Notes (Signed)
History   This chart was scribed for Harry Horn, MD by Leone Payor, ED Scribe. This patient was seen in room MH08/MH08 and the patient's care was started at 2043.   CSN: [id number]  Arrival date & time 09/13/12  6433   First MD Initiated Contact with Patient 09/13/12 2043      Chief Complaint  Patient presents with  . Arm Pain    The history is provided by the patient. No language interpreter was used.    Harry Arnold is a 47 y.o. male who presents to the Emergency Department complaining of new, constant, gradually worsening, moderate to severe left arm pain starting last night after falling off 2 stairs and landing onto his left arm. He also has left wrist and hand pain. Pt denies taking OTC pain medication for the pain. He denies head pain, neck pain, back pain, abdominal pain, elbow pain.  No weak/numbness. Pt has h/o HTN, GERD.  Pt denies smoking and alcohol use.  Past Medical History  Diagnosis Date  . Hypertension   . GERD (gastroesophageal reflux disease)     Past Surgical History  Procedure Date  . Testicular cancer     No family history on file.  History  Substance Use Topics  . Smoking status: Never Smoker   . Smokeless tobacco: Not on file  . Alcohol Use: No      Review of Systems 10 Systems reviewed and all are negative for acute change except as noted in the HPI.    Allergies  Flexeril  Home Medications   Current Outpatient Rx  Name  Route  Sig  Dispense  Refill  . OMEGA-3 FATTY ACIDS 1000 MG PO CAPS   Oral   Take 3 g by mouth daily.         Marland Kitchen HYDROCODONE-ACETAMINOPHEN 5-325 MG PO TABS   Oral   Take 2 tablets by mouth every 4 (four) hours as needed for pain.   20 tablet   0   . LANSOPRAZOLE 30 MG PO CPDR   Oral   Take 1 capsule (30 mg total) by mouth daily.   30 capsule   1     BP 147/92  Pulse 97  Temp 99 F (37.2 C) (Oral)  Resp 20  SpO2 98%  Physical Exam  Nursing note and vitals reviewed. Constitutional:   Awake, alert, nontoxic appearance.  HENT:  Head: Atraumatic.  Eyes: Right eye exhibits no discharge. Left eye exhibits no discharge.  Neck: Neck supple.  Pulmonary/Chest: Effort normal. He exhibits no tenderness.  Abdominal: Soft. There is no tenderness. There is no rebound.  Musculoskeletal: He exhibits no tenderness.       Non tender collar bone and left shoulder. Left upper arm has mild diffuse tenderness. Left elbow is non tender. Left wrist has mild diffuse tenderness. Left hand has mild diffuse tenderness. Left hand has CR less than 2 sec.  Normal LT with intact motor and distributions of median, radian, ulnar nerve function. Left shoulder abnormal drop test.   Neurological:       Mental status and motor strength appears baseline for patient and situation.  Skin: No rash noted.  Psychiatric: He has a normal mood and affect.    ED Course  Procedures (including critical care time)  DIAGNOSTIC STUDIES: Oxygen Saturation is 98% on room air, normal by my interpretation.    COORDINATION OF CARE:   8:48PM Patient / Family / Caregiver understand and agree with initial ED impression and  plan with expectations set for ED visit.   Labs Reviewed - No data to display Dg Wrist Complete Left  09/13/2012  *RADIOLOGY REPORT*  Clinical Data: Left wrist pain after fall.  LEFT WRIST - COMPLETE 3+ VIEW  Comparison: Plain film of the wrist 12/25/2009.  Findings: No acute bony or joint abnormality is identified.  Small erosion is seen in the ulnar styloid.  Osseous structures otherwise appear normal.  Soft tissues are unremarkable.  IMPRESSION:  1.  No acute finding. 2.  Small erosion ulnar styloid is nonspecific but could be due to inflammatory arthropathy.   Original Report Authenticated By: Holley Dexter, M.D.    Dg Humerus Left  09/13/2012  *RADIOLOGY REPORT*  Clinical Data: Fall.  Left arm pain.  LEFT HUMERUS - 2+ VIEW  Comparison: None.  Findings: Left humerus appears intact.  Soft tissues  appear normal. No fracture.  IMPRESSION: Negative.   Original Report Authenticated By: Andreas Newport, M.D.    Dg Hand Complete Left  09/13/2012  *RADIOLOGY REPORT*  Clinical Data: Pain after fall.  LEFT HAND - COMPLETE 3+ VIEW  Comparison: None.  Findings: No acute bony or joint abnormality is identified.  There is an old tuft fracture of the ring finger.  A small erosion is noted in the ulnar styloid.  IMPRESSION:  1.  No acute finding. 2.  Old tuft fracture left ring finger. 3.  Small erosion in the ulnar styloid is nonspecific but could be due to inflammatory arthropathy.   Original Report Authenticated By: Holley Dexter, M.D.      1. Left shoulder strain   2. Left wrist sprain       MDM   I personally performed the services described in this documentation, which was scribed in my presence. The recorded information has been reviewed and is accurate.  Patient / Family / Caregiver informed of clinical course, understand medical decision-making process, and agree with plan.  I doubt any other EMC precluding discharge at this time.  Harry Horn, MD 09/14/12 [phone]

## 2015-10-18 ENCOUNTER — Emergency Department (HOSPITAL_BASED_OUTPATIENT_CLINIC_OR_DEPARTMENT_OTHER): Payer: Self-pay

## 2015-10-18 ENCOUNTER — Emergency Department (HOSPITAL_BASED_OUTPATIENT_CLINIC_OR_DEPARTMENT_OTHER)
Admission: EM | Admit: 2015-10-18 | Discharge: 2015-10-18 | Disposition: A | Discharge status: Home / Self Care | Payer: Self-pay | Attending: Emergency Medicine | Admitting: Emergency Medicine

## 2015-10-18 ENCOUNTER — Encounter (HOSPITAL_BASED_OUTPATIENT_CLINIC_OR_DEPARTMENT_OTHER): Payer: Self-pay | Admitting: *Deleted

## 2015-10-18 DIAGNOSIS — Z7982 Long term (current) use of aspirin: Secondary | ICD-10-CM | POA: Insufficient documentation

## 2015-10-18 DIAGNOSIS — I10 Essential (primary) hypertension: Secondary | ICD-10-CM | POA: Insufficient documentation

## 2015-10-18 DIAGNOSIS — K219 Gastro-esophageal reflux disease without esophagitis: Secondary | ICD-10-CM | POA: Insufficient documentation

## 2015-10-18 DIAGNOSIS — Z79899 Other long term (current) drug therapy: Secondary | ICD-10-CM | POA: Insufficient documentation

## 2015-10-18 DIAGNOSIS — Y9389 Activity, other specified: Secondary | ICD-10-CM | POA: Insufficient documentation

## 2015-10-18 DIAGNOSIS — W06XXXA Fall from bed, initial encounter: Secondary | ICD-10-CM | POA: Insufficient documentation

## 2015-10-18 DIAGNOSIS — Y9289 Other specified places as the place of occurrence of the external cause: Secondary | ICD-10-CM | POA: Insufficient documentation

## 2015-10-18 DIAGNOSIS — S4992XA Unspecified injury of left shoulder and upper arm, initial encounter: Secondary | ICD-10-CM | POA: Insufficient documentation

## 2015-10-18 DIAGNOSIS — M79602 Pain in left arm: Secondary | ICD-10-CM

## 2015-10-18 DIAGNOSIS — Y998 Other external cause status: Secondary | ICD-10-CM | POA: Insufficient documentation

## 2015-10-18 MED ORDER — DICLOFENAC SODIUM 1 % TD GEL: 2.0000 g | Freq: Four times a day (QID) | TRANSDERMAL | Status: DC

## 2015-10-18 NOTE — Discharge Instructions (Signed)
Use tylenol as needed for pain. Follow up with primary care provider if pain does not improve after one week. Return to ER for new or worsening symptoms, any additional concerns.   COLD THERAPY DIRECTIONS:  Ice or gel packs can be used to reduce both pain and swelling. Ice is the most helpful within the first 24 to 48 hours after an injury or flareup from overusing a muscle or joint.  Ice is effective, has very few side effects, and is safe for most people to use.   If you expose your skin to cold temperatures for too long or without the proper protection, you can damage your skin or nerves. Watch for signs of skin damage due to cold.   HOME CARE INSTRUCTIONS  Follow these tips to use ice and cold packs safely.  Place a dry or damp towel between the ice and skin. A damp towel will cool the skin more quickly, so you may need to shorten the time that the ice is used.  For a more rapid response, add gentle compression to the ice.  Ice for no more than 10 to 20 minutes at a time. The bonier the area you are icing, the less time it will take to get the benefits of ice.  Check your skin after 5 minutes to make sure there are no signs of a poor response to cold or skin damage.  Rest 20 minutes or more in between uses.  Once your skin is numb, you can end your treatment. You can test numbness by very lightly touching your skin. The touch should be so light that you do not see the skin dimple from the pressure of your fingertip. When using ice, most people will feel these normal sensations in this order: cold, burning, aching, and numbness.

## 2015-10-18 NOTE — ED Provider Notes (Signed)
CSN: [id number]     Arrival date & time 10/18/15  1753 History   First MD Initiated Contact with Patient 10/18/15 1758     Chief Complaint  Patient presents with  . Arm Injury     (Consider location/radiation/quality/duration/timing/severity/associated sxs/prior Treatment) Patient is a 50 y.o. male presenting with arm injury. The history is provided by the patient and medical records. No language interpreter was used.  Arm Injury Associated symptoms: no fever     Harry Arnold is a 50 y.o. male  with a PMH of HTN, GERD who presents to the Emergency Department complaining of acute onset of left forearm/elbow pain after falling out of the bed last night and landing on left upper extremity. Patient states pain is worse with movement. Aspirin taken PTA with some relief. Denies numbness/tingling. Did not hit head during fall. No loc. No n/v.   Past Medical History  Diagnosis Date  . Hypertension   . GERD (gastroesophageal reflux disease)    Past Surgical History  Procedure Laterality Date  . Testicular cancer     No family history on file. Social History  Substance Use Topics  . Smoking status: Never Smoker   . Smokeless tobacco: None  . Alcohol Use: No    Review of Systems  Constitutional: Negative for fever and chills.  HENT: Negative for congestion.   Eyes: Negative for visual disturbance.  Respiratory: Negative for cough and shortness of breath.   Cardiovascular: Negative for chest pain.  Gastrointestinal: Negative for nausea, vomiting and abdominal pain.  Genitourinary: Negative for dysuria.  Musculoskeletal: Positive for myalgias and arthralgias.  Skin: Negative for rash.  Neurological: Negative for dizziness, weakness and headaches.      Allergies  Flexeril  Home Medications   Prior to Admission medications   Medication Sig Start Date End Date Taking? Authorizing Provider  aspirin 81 MG tablet Take 81 mg by mouth daily.   Yes Historical Provider, MD    diclofenac sodium (VOLTAREN) 1 % GEL Apply 2 g topically 4 (four) times daily. 10/18/15   Ozella Almond Charlie Char, PA-C  fish oil-omega-3 fatty acids 1000 MG capsule Take 3 g by mouth daily.    Historical Provider, MD  HYDROcodone-acetaminophen (NORCO/VICODIN) 5-325 MG per tablet Take 2 tablets by mouth every 4 (four) hours as needed for pain. 08/16/12   Leonard Schwartz, MD  lansoprazole (PREVACID) 30 MG capsule Take 1 capsule (30 mg total) by mouth daily. 02/16/12 02/15/13  Barton Dubois, MD   BP 142/83 mmHg  Pulse 81  Temp(Src) 98.7 F (37.1 C) (Oral)  Resp 18  Ht 5\' 10"  (1.778 m)  Wt 95.255 kg  BMI 30.13 kg/m2  SpO2 97% Physical Exam  Constitutional: He is oriented to person, place, and time. He appears well-developed and well-nourished.  Alert and in no acute distress  HENT:  Head: Normocephalic and atraumatic.  Cardiovascular: Normal rate, regular rhythm and normal heart sounds.   Pulmonary/Chest: Effort normal and breath sounds normal. No respiratory distress. He has no wheezes. He has no rales.  Abdominal: Soft. Bowel sounds are normal. He exhibits no distension and no mass. There is no tenderness. There is no rebound and no guarding.  Musculoskeletal:       Arms: Full ROM of LUE. No erythema, ecchymosis, swelling, or deformity appreciated. Diffuse TTP as depicted in image. 2+ radial pulse. Sensation intact in radial, ulnar, and median nerve distribution.   Neurological: He is alert and oriented to person, place, and time.  Skin: Skin is  warm and dry. No rash noted.  Cap refill < 3 seconds.   Psychiatric: He has a normal mood and affect. His behavior is normal. Judgment and thought content normal.  Nursing note and vitals reviewed.   ED Course  Procedures (including critical care time) Labs Review Labs Reviewed - No data to display  Imaging Review Dg Forearm Left  10/18/2015  CLINICAL DATA:  Pt states that he fell out of bed last night onto a brick step, pt c.o pain in proximal  forearm EXAM: LEFT FOREARM - 2 VIEW COMPARISON:  None. FINDINGS: No fracture. The elbow and wrist joints are normally aligned. No bone lesion. Soft tissues are unremarkable. IMPRESSION: Negative. Electronically Signed   By: Lajean Manes M.D.   On: 10/18/2015 18:19   I have personally reviewed and evaluated these images and lab results as part of my medical decision-making.   EKG Interpretation None      MDM   Final diagnoses:  Left arm pain   Harry Arnold presents with acute left arm pain after falling out of bed last night. On exam, patient is diffusely tender to forearm/elbow with full ROM. Imaging shows no acute abnormalities. Will tx with symptomatic care and voltaren gel. PCP follow up encouraged.   [facility] Harry Borowski, PA-C 10/18/15 1907  Harry Dakin, MD 10/19/15 [phone]

## 2015-10-18 NOTE — ED Notes (Signed)
He fell out of bed last night. Injury to his left elbow. Pain radiates up his arm.

## 2015-12-22 ENCOUNTER — Emergency Department (HOSPITAL_BASED_OUTPATIENT_CLINIC_OR_DEPARTMENT_OTHER): Payer: Self-pay

## 2015-12-22 ENCOUNTER — Emergency Department (HOSPITAL_BASED_OUTPATIENT_CLINIC_OR_DEPARTMENT_OTHER)
Admission: EM | Admit: 2015-12-22 | Discharge: 2015-12-22 | Disposition: A | Discharge status: Home / Self Care | Payer: Self-pay | Attending: Emergency Medicine | Admitting: Emergency Medicine

## 2015-12-22 ENCOUNTER — Encounter (HOSPITAL_BASED_OUTPATIENT_CLINIC_OR_DEPARTMENT_OTHER): Payer: Self-pay | Admitting: Emergency Medicine

## 2015-12-22 DIAGNOSIS — M533 Sacrococcygeal disorders, not elsewhere classified: Secondary | ICD-10-CM | POA: Insufficient documentation

## 2015-12-22 DIAGNOSIS — Z7982 Long term (current) use of aspirin: Secondary | ICD-10-CM | POA: Insufficient documentation

## 2015-12-22 DIAGNOSIS — Z87442 Personal history of urinary calculi: Secondary | ICD-10-CM | POA: Insufficient documentation

## 2015-12-22 DIAGNOSIS — M549 Dorsalgia, unspecified: Secondary | ICD-10-CM

## 2015-12-22 DIAGNOSIS — I10 Essential (primary) hypertension: Secondary | ICD-10-CM | POA: Insufficient documentation

## 2015-12-22 DIAGNOSIS — Z791 Long term (current) use of non-steroidal anti-inflammatories (NSAID): Secondary | ICD-10-CM | POA: Insufficient documentation

## 2015-12-22 DIAGNOSIS — Z79899 Other long term (current) drug therapy: Secondary | ICD-10-CM | POA: Insufficient documentation

## 2015-12-22 DIAGNOSIS — K219 Gastro-esophageal reflux disease without esophagitis: Secondary | ICD-10-CM | POA: Insufficient documentation

## 2015-12-22 DIAGNOSIS — M545 Low back pain: Secondary | ICD-10-CM | POA: Insufficient documentation

## 2015-12-22 HISTORY — DX: Diverticulitis of intestine, part unspecified, without perforation or abscess without bleeding: K57.92

## 2015-12-22 HISTORY — DX: Calculus of kidney: N20.0

## 2015-12-22 LAB — URINALYSIS, ROUTINE W REFLEX MICROSCOPIC
Bilirubin Urine: NEGATIVE
GLUCOSE, UA: NEGATIVE mg/dL
Hgb urine dipstick: NEGATIVE
Ketones, ur: NEGATIVE mg/dL
LEUKOCYTES UA: NEGATIVE
Nitrite: NEGATIVE
PH: 6 (ref 5.0–8.0)
Protein, ur: NEGATIVE mg/dL
SPECIFIC GRAVITY, URINE: 1.024 (ref 1.005–1.030)

## 2015-12-22 LAB — CBG MONITORING, ED: Glucose-Capillary: 106 mg/dL — ABNORMAL HIGH (ref 65–99)

## 2015-12-22 MED ORDER — OXYCODONE-ACETAMINOPHEN 5-325 MG PO TABS: 1.0000 | ORAL_TABLET | ORAL | Status: DC | PRN

## 2015-12-22 MED ORDER — DEXAMETHASONE 6 MG PO TABS
10.0000 mg | ORAL_TABLET | Freq: Once | ORAL | Status: AC
  Administered 2015-12-22: 10 mg via ORAL
  Filled 2015-12-22: qty 1

## 2015-12-22 MED ORDER — DICLOFENAC SODIUM 1 % TD GEL: 2.0000 g | Freq: Four times a day (QID) | TRANSDERMAL | Status: DC

## 2015-12-22 MED ORDER — OXYCODONE-ACETAMINOPHEN 5-325 MG PO TABS
1.0000 | ORAL_TABLET | Freq: Once | ORAL | Status: AC
  Administered 2015-12-22: 1 via ORAL
  Filled 2015-12-22: qty 1

## 2015-12-22 MED ORDER — DEXAMETHASONE 2 MG PO TABS: 10.0000 mg | ORAL_TABLET | Freq: Once | ORAL | Status: DC

## 2015-12-22 NOTE — ED Provider Notes (Signed)
CSN: [id number]     Arrival date & time 12/22/15  1551 History  By signing my name below, I, Altamease Oiler, attest that this documentation has been prepared under the direction and in the presence of Harvel Quale, MD. Electronically Signed: Altamease Oiler, ED Scribe. 12/22/2015. 5:17 PM   Chief Complaint  Patient presents with  . Back Pain   The history is provided by the patient. No language interpreter was used.   Harry Arnold is a 50 y.o. male with history of kidney stones who presents to the Emergency Department complaining of new, 9/10 in severity,  progressively worsening lower back pain with onset 2 weeks ago. The pain has been significantly worse in the last 3-4 days. His wife notes that the pain started shortly after he did a lot of bending while moving brush. The pain is exacerbated by movement and palpation. This pain feels different than the kidney stone pain that he has had in the past.  Pt denies hematuria. No history of DM or arthritis. In the past he has had a rash and tongue swelling with ibuprofen. He has also had oral swelling after flexeril. Pt denies any adverse reactions after taking steroids in the past.   The pt as no PCP or medical insurance.   Past Medical History  Diagnosis Date  . Hypertension   . GERD (gastroesophageal reflux disease)   . Kidney stones   . Diverticulitis    Past Surgical History  Procedure Laterality Date  . Testicular cancer     No family history on file. Social History  Substance Use Topics  . Smoking status: Never Smoker   . Smokeless tobacco: None  . Alcohol Use: No    Review of Systems  Genitourinary: Negative for hematuria.  Musculoskeletal: Positive for back pain.  All other systems reviewed and are negative.   Allergies  Ibuprofen and Flexeril  Home Medications   Prior to Admission medications   Medication Sig Start Date End Date Taking? Authorizing Provider  aspirin 81 MG tablet Take 81 mg by mouth daily.     Historical Provider, MD  diclofenac sodium (VOLTAREN) 1 % GEL Apply 2 g topically 4 (four) times daily. 10/18/15   Ozella Almond Ward, PA-C  fish oil-omega-3 fatty acids 1000 MG capsule Take 3 g by mouth daily.    Historical Provider, MD  HYDROcodone-acetaminophen (NORCO/VICODIN) 5-325 MG per tablet Take 2 tablets by mouth every 4 (four) hours as needed for pain. 08/16/12   Leonard Schwartz, MD  lansoprazole (PREVACID) 30 MG capsule Take 1 capsule (30 mg total) by mouth daily. 02/16/12 02/15/13  Barton Dubois, MD   BP 149/120 mmHg  Pulse 92  Temp(Src) 98.7 F (37.1 C) (Oral)  Resp 18  Ht 5\' 10"  (1.778 m)  Wt 230 lb (104.327 kg)  BMI 33.00 kg/m2  SpO2 98% Physical Exam  Constitutional: He is oriented to person, place, and time. He appears well-developed and well-nourished.  HENT:  Head: Normocephalic and atraumatic.  Eyes: EOM are normal.  Neck: Normal range of motion.  Cardiovascular: Normal rate, regular rhythm, normal heart sounds and intact distal pulses.   Pulmonary/Chest: Effort normal and breath sounds normal. No respiratory distress.  Abdominal: Soft. He exhibits no distension. There is no tenderness.  No flank tenderness  Musculoskeletal: Normal range of motion.  Tenderness over right SI joint +SLR for the left leg No midline tenderness over the spine  Neurological: He is alert and oriented to person, place, and time.  Normal  strength and sensation in BLEs  Skin: Skin is warm and dry.  Psychiatric: He has a normal mood and affect. Judgment normal.  Nursing note and vitals reviewed.   ED Course  Procedures (including critical care time) DIAGNOSTIC STUDIES: Oxygen Saturation is 98% on RA,  normal by my interpretation.    COORDINATION OF CARE: 4:47 PM Discussed treatment plan which includes UA, lumbar XR, and pain management with pt at bedside and pt agreed to plan.  Labs Review Labs Reviewed  CBG MONITORING, ED - Abnormal; Notable for the following:    Glucose-Capillary  106 (*)    All other components within normal limits  URINALYSIS, ROUTINE W REFLEX MICROSCOPIC (NOT AT [facility])    Imaging Review No results found. I personally reviewed and evaluated these images and lab results as a part of my medical decision-making.   EKG Interpretation None      MDM  Patient was seen and evaluated in stable condition. Patient neurovascularly intact. Tenderness over the right SI joint. X-ray without acute finding. Symptoms improved with Percocet and Decadron. Patient without any red flag symptoms on examination or history. Discussed need for outpatient follow-up and return precautions at length with patient and his wife who expressed understanding and agreement. Patient was discharged home in stable condition with a prescription for Percocet as well as another dose of Decadron. They expressed understanding and agreement with plan of care. Final diagnoses:  None    1. Lower back pain, possible sacroiliitis   I personally performed the services described in this documentation, which was scribed in my presence. The recorded information has been reviewed and is accurate.   Harvel Quale, MD 12/22/15 [phone]

## 2015-12-22 NOTE — ED Notes (Signed)
MD at bedside. 

## 2015-12-22 NOTE — ED Notes (Signed)
Pt has not taken any medications this morning for pain or other.

## 2015-12-22 NOTE — Discharge Instructions (Signed)
You were seen and evaluated today for your lower back pain. This is likely related to inflammation and was caught her sacroiliac joint. Take the medications prescribed. Rest and follow-up outpatient with the primary care physician.  Back Pain, Adult Back pain is very common in adults.The cause of back pain is rarely dangerous and the pain often gets better over time.The cause of your back pain may not be known. Some common causes of back pain include: 1. Strain of the muscles or ligaments supporting the spine. 2. Wear and tear (degeneration) of the spinal disks. 3. Arthritis. 4. Direct injury to the back. For many people, back pain may return. Since back pain is rarely dangerous, most people can learn to manage this condition on their own. HOME CARE INSTRUCTIONS Watch your back pain for any changes. The following actions may help to lessen any discomfort you are feeling: 1. Remain active. It is stressful on your back to sit or stand in one place for long periods of time. Do not sit, drive, or stand in one place for more than 30 minutes at a time. Take short walks on even surfaces as soon as you are able.Try to increase the length of time you walk each day. 2. Exercise regularly as directed by your health care provider. Exercise helps your back heal faster. It also helps avoid future injury by keeping your muscles strong and flexible. 3. Do not stay in bed.Resting more than 1-2 days can delay your recovery. 4. Pay attention to your body when you bend and lift. The most comfortable positions are those that put less stress on your recovering back. Always use proper lifting techniques, including: 1. Bending your knees. 2. Keeping the load close to your body. 3. Avoiding twisting. 5. Find a comfortable position to sleep. Use a firm mattress and lie on your side with your knees slightly bent. If you lie on your back, put a pillow under your knees. 6. Avoid feeling anxious or stressed.Stress  increases muscle tension and can worsen back pain.It is important to recognize when you are anxious or stressed and learn ways to manage it, such as with exercise. 7. Take medicines only as directed by your health care provider. Over-the-counter medicines to reduce pain and inflammation are often the most helpful.Your health care provider may prescribe muscle relaxant drugs.These medicines help dull your pain so you can more quickly return to your normal activities and healthy exercise. 8. Apply ice to the injured area: 1. Put ice in a plastic bag. 2. Place a towel between your skin and the bag. 3. Leave the ice on for 20 minutes, 2-3 times a day for the first 2-3 days. After that, ice and heat may be alternated to reduce pain and spasms. 9. Maintain a healthy weight. Excess weight puts extra stress on your back and makes it difficult to maintain good posture. SEEK MEDICAL CARE IF: 1. You have pain that is not relieved with rest or medicine. 2. You have increasing pain going down into the legs or buttocks. 3. You have pain that does not improve in one week. 4. You have night pain. 5. You lose weight. 6. You have a fever or chills. SEEK IMMEDIATE MEDICAL CARE IF:  1. You develop new bowel or bladder control problems. 2. You have unusual weakness or numbness in your arms or legs. 3. You develop nausea or vomiting. 4. You develop abdominal pain. 5. You feel faint.   This information is not intended to replace advice given  to you by your health care provider. Make sure you discuss any questions you have with your health care provider.   Document Released: 08/25/2005 Document Revised: 09/15/2014 Document Reviewed: 12/27/2013 Elsevier Interactive Patient Education 2016 Elsevier Inc.   Back Exercises The following exercises strengthen the muscles that help to support the back. They also help to keep the lower back flexible. Doing these exercises can help to prevent back pain or lessen  existing pain. If you have back pain or discomfort, try doing these exercises 2-3 times each day or as told by your health care provider. When the pain goes away, do them once each day, but increase the number of times that you repeat the steps for each exercise (do more repetitions). If you do not have back pain or discomfort, do these exercises once each day or as told by your health care provider. EXERCISES Single Knee to Chest Repeat these steps 3-5 times for each leg: 5. Lie on your back on a firm bed or the floor with your legs extended. 6. Bring one knee to your chest. Your other leg should stay extended and in contact with the floor. 7. Hold your knee in place by grabbing your knee or thigh. 8. Pull on your knee until you feel a gentle stretch in your lower back. 9. Hold the stretch for 10-30 seconds. 10. Slowly release and straighten your leg. Pelvic Tilt Repeat these steps 5-10 times: 10. Lie on your back on a firm bed or the floor with your legs extended. Sweeny your knees so they are pointing toward the ceiling and your feet are flat on the floor. 12. Tighten your lower abdominal muscles to press your lower back against the floor. This motion will tilt your pelvis so your tailbone points up toward the ceiling instead of pointing to your feet or the floor. 13. With gentle tension and even breathing, hold this position for 5-10 seconds. Cat-Cow Repeat these steps until your lower back becomes more flexible: 7. Get into a hands-and-knees position on a firm surface. Keep your hands under your shoulders, and keep your knees under your hips. You may place padding under your knees for comfort. 8. Let your head hang down, and point your tailbone toward the floor so your lower back becomes rounded like the back of a cat. 9. Hold this position for 5 seconds. 10. Slowly lift your head and point your tailbone up toward the ceiling so your back forms a sagging arch like the back of a  cow. 11. Hold this position for 5 seconds. Press-Ups Repeat these steps 5-10 times: 6. Lie on your abdomen (face-down) on the floor. 7. Place your palms near your head, about shoulder-width apart. 8. While you keep your back as relaxed as possible and keep your hips on the floor, slowly straighten your arms to raise the top half of your body and lift your shoulders. Do not use your back muscles to raise your upper torso. You may adjust the placement of your hands to make yourself more comfortable. 9. Hold this position for 5 seconds while you keep your back relaxed. 10. Slowly return to lying flat on the floor. Bridges Repeat these steps 10 times: 1. Lie on your back on a firm surface. 2. Bend your knees so they are pointing toward the ceiling and your feet are flat on the floor. 3. Tighten your buttocks muscles and lift your buttocks off of the floor until your waist is at almost the same height  as your knees. You should feel the muscles working in your buttocks and the back of your thighs. If you do not feel these muscles, slide your feet 1-2 inches farther away from your buttocks. 4. Hold this position for 3-5 seconds. 5. Slowly lower your hips to the starting position, and allow your buttocks muscles to relax completely. If this exercise is too easy, try doing it with your arms crossed over your chest. Abdominal Crunches Repeat these steps 5-10 times: 1. Lie on your back on a firm bed or the floor with your legs extended. 2. Bend your knees so they are pointing toward the ceiling and your feet are flat on the floor. 3. Cross your arms over your chest. 4. Tip your chin slightly toward your chest without bending your neck. 5. Tighten your abdominal muscles and slowly raise your trunk (torso) high enough to lift your shoulder blades a tiny bit off of the floor. Avoid raising your torso higher than that, because it can put too much stress on your low back and it does not help to strengthen  your abdominal muscles. 6. Slowly return to your starting position. Back Lifts Repeat these steps 5-10 times: 1. Lie on your abdomen (face-down) with your arms at your sides, and rest your forehead on the floor. 2. Tighten the muscles in your legs and your buttocks. 3. Slowly lift your chest off of the floor while you keep your hips pressed to the floor. Keep the back of your head in line with the curve in your back. Your eyes should be looking at the floor. 4. Hold this position for 3-5 seconds. 5. Slowly return to your starting position. SEEK MEDICAL CARE IF:  Your back pain or discomfort gets much worse when you do an exercise.  Your back pain or discomfort does not lessen within 2 hours after you exercise. If you have any of these problems, stop doing these exercises right away. Do not do them again unless your health care provider says that you can. SEEK IMMEDIATE MEDICAL CARE IF:  You develop sudden, severe back pain. If this happens, stop doing the exercises right away. Do not do them again unless your health care provider says that you can.   This information is not intended to replace advice given to you by your health care provider. Make sure you discuss any questions you have with your health care provider.   Document Released: 10/02/2004 Document Revised: 05/16/2015 Document Reviewed: 10/19/2014 Elsevier Interactive Patient Education [employer].

## 2015-12-22 NOTE — ED Notes (Signed)
States it is difficult to stand, sit, etc, has to change positions frequently

## 2015-12-22 NOTE — ED Notes (Signed)
Pt presents with mid lower back pain, states pain radiates into buttocks and down both legs. States began having back discomfort approx 2 weeks ago, denies any injury or strain to back, in the past 3-4 days states pain has become worse.

## 2015-12-22 NOTE — ED Notes (Signed)
Patient transported to X-ray 

## 2015-12-22 NOTE — ED Notes (Signed)
Pt reports back pain with increasing intensity for past 3-4 days, lower lumbar/sacral area bilateral and worse with positioning and movement.  Pt states light burning with urination but reports history of kidney stones and states this feels different.

## 2016-04-08 DIAGNOSIS — R079 Chest pain, unspecified: Secondary | ICD-10-CM

## 2016-04-08 HISTORY — DX: Chest pain, unspecified: R07.9

## 2016-04-23 ENCOUNTER — Encounter (HOSPITAL_BASED_OUTPATIENT_CLINIC_OR_DEPARTMENT_OTHER): Payer: Self-pay | Admitting: *Deleted

## 2016-04-23 ENCOUNTER — Emergency Department (HOSPITAL_BASED_OUTPATIENT_CLINIC_OR_DEPARTMENT_OTHER): Payer: Self-pay

## 2016-04-23 ENCOUNTER — Observation Stay (HOSPITAL_BASED_OUTPATIENT_CLINIC_OR_DEPARTMENT_OTHER)
Admission: EM | Admit: 2016-04-23 | Discharge: 2016-04-25 | Disposition: A | Discharge status: Home / Self Care | Payer: Self-pay | Attending: Internal Medicine | Admitting: Internal Medicine

## 2016-04-23 DIAGNOSIS — K219 Gastro-esophageal reflux disease without esophagitis: Secondary | ICD-10-CM | POA: Insufficient documentation

## 2016-04-23 DIAGNOSIS — I10 Essential (primary) hypertension: Secondary | ICD-10-CM | POA: Insufficient documentation

## 2016-04-23 DIAGNOSIS — F17200 Nicotine dependence, unspecified, uncomplicated: Secondary | ICD-10-CM | POA: Insufficient documentation

## 2016-04-23 DIAGNOSIS — Z7982 Long term (current) use of aspirin: Secondary | ICD-10-CM | POA: Insufficient documentation

## 2016-04-23 DIAGNOSIS — R0789 Other chest pain: Principal | ICD-10-CM | POA: Insufficient documentation

## 2016-04-23 DIAGNOSIS — I6529 Occlusion and stenosis of unspecified carotid artery: Secondary | ICD-10-CM | POA: Insufficient documentation

## 2016-04-23 DIAGNOSIS — R079 Chest pain, unspecified: Secondary | ICD-10-CM | POA: Diagnosis present

## 2016-04-23 DIAGNOSIS — I200 Unstable angina: Secondary | ICD-10-CM

## 2016-04-23 HISTORY — DX: Chest pain, unspecified: R07.9

## 2016-04-23 HISTORY — DX: Malignant (primary) neoplasm, unspecified: C80.1

## 2016-04-23 LAB — COMPREHENSIVE METABOLIC PANEL
ALK PHOS: 58 U/L (ref 38–126)
ALT: 35 U/L (ref 17–63)
ANION GAP: 8 (ref 5–15)
AST: 30 U/L (ref 15–41)
Albumin: 4 g/dL (ref 3.5–5.0)
BUN: 17 mg/dL (ref 6–20)
CALCIUM: 8.9 mg/dL (ref 8.9–10.3)
CO2: 25 mmol/L (ref 22–32)
Chloride: 104 mmol/L (ref 101–111)
Creatinine, Ser: 1.18 mg/dL (ref 0.61–1.24)
GFR calc non Af Amer: >60 mL/min (ref >60)
Glucose, Bld: 121 mg/dL — ABNORMAL HIGH (ref 65–99)
POTASSIUM: 3.2 mmol/L — AB (ref 3.5–5.1)
SODIUM: 137 mmol/L (ref 135–145)
Total Bilirubin: 0.8 mg/dL (ref 0.3–1.2)
Total Protein: 6.8 g/dL (ref 6.5–8.1)

## 2016-04-23 LAB — TROPONIN I
TROPONIN I: 0.03 ng/mL — AB (ref <0.03)
Troponin I: 0.03 ng/mL (ref <0.03)

## 2016-04-23 LAB — URINALYSIS, ROUTINE W REFLEX MICROSCOPIC
BILIRUBIN URINE: NEGATIVE
Glucose, UA: NEGATIVE mg/dL
Hgb urine dipstick: NEGATIVE
KETONES UR: NEGATIVE mg/dL
Leukocytes, UA: NEGATIVE
NITRITE: NEGATIVE
Protein, ur: NEGATIVE mg/dL
SPECIFIC GRAVITY, URINE: 1.009 (ref 1.005–1.030)
pH: 6 (ref 5.0–8.0)

## 2016-04-23 LAB — CBC WITH DIFFERENTIAL/PLATELET
Basophils Absolute: 0 10*3/uL (ref 0.0–0.1)
Basophils Relative: 1 %
EOS ABS: 0.2 10*3/uL (ref 0.0–0.7)
EOS PCT: 3 %
HCT: 40.1 % (ref 39.0–52.0)
HEMOGLOBIN: 13.9 g/dL (ref 13.0–17.0)
LYMPHS ABS: 2 10*3/uL (ref 0.7–4.0)
Lymphocytes Relative: 26 %
MCH: 32.4 pg (ref 26.0–34.0)
MCHC: 34.7 g/dL (ref 30.0–36.0)
MCV: 93.5 fL (ref 78.0–100.0)
MONO ABS: 0.8 10*3/uL (ref 0.1–1.0)
MONOS PCT: 11 %
NEUTROS PCT: 59 %
Neutro Abs: 4.5 10*3/uL (ref 1.7–7.7)
PLATELETS: 202 10*3/uL (ref 150–400)
RBC: 4.29 MIL/uL (ref 4.22–5.81)
RDW: 13 % (ref 11.5–15.5)
WBC: 7.5 10*3/uL (ref 4.0–10.5)

## 2016-04-23 LAB — RAPID URINE DRUG SCREEN, HOSP PERFORMED
Amphetamines: NOT DETECTED
BARBITURATES: NOT DETECTED
Benzodiazepines: NOT DETECTED
COCAINE: NOT DETECTED
OPIATES: NOT DETECTED
TETRAHYDROCANNABINOL: NOT DETECTED

## 2016-04-23 LAB — MAGNESIUM: Magnesium: 2.1 mg/dL (ref 1.7–2.4)

## 2016-04-23 LAB — D-DIMER, QUANTITATIVE: D-Dimer, Quant: <0.27 ug/mL-FEU (ref 0.00–0.50)

## 2016-04-23 LAB — PROTIME-INR
INR: 0.92
PROTHROMBIN TIME: 12.4 s (ref 11.4–15.2)

## 2016-04-23 LAB — APTT: aPTT: 26 seconds (ref 24–36)

## 2016-04-23 MED ORDER — ASPIRIN 81 MG PO CHEW
324.0000 mg | CHEWABLE_TABLET | Freq: Once | ORAL | Status: AC
  Administered 2016-04-23: 324 mg via ORAL
  Filled 2016-04-23: qty 4

## 2016-04-23 MED ORDER — ONDANSETRON HCL 4 MG/2ML IJ SOLN
4.0000 mg | Freq: Once | INTRAMUSCULAR | Status: AC
  Administered 2016-04-23: 4 mg via INTRAVENOUS
  Filled 2016-04-23: qty 2

## 2016-04-23 MED ORDER — MORPHINE SULFATE (PF) 4 MG/ML IV SOLN
4.0000 mg | Freq: Once | INTRAVENOUS | Status: AC
  Administered 2016-04-23: 4 mg via INTRAVENOUS
  Filled 2016-04-23: qty 1

## 2016-04-23 MED ORDER — HEPARIN SODIUM (PORCINE) 5000 UNIT/ML IJ SOLN
4000.0000 [IU] | INTRAMUSCULAR | Status: AC
  Administered 2016-04-23: 4000 [IU] via INTRAVENOUS
  Filled 2016-04-23: qty 1

## 2016-04-23 MED ORDER — SODIUM CHLORIDE 0.9 % IV SOLN
10.0000 mL/h | INTRAVENOUS | Status: DC
  Administered 2016-04-23: 20 mL/h via INTRAVENOUS

## 2016-04-23 MED ORDER — POTASSIUM CHLORIDE CRYS ER 20 MEQ PO TBCR
40.0000 meq | EXTENDED_RELEASE_TABLET | Freq: Once | ORAL | Status: AC
  Administered 2016-04-23: 40 meq via ORAL
  Filled 2016-04-23: qty 2

## 2016-04-23 MED ORDER — NITROGLYCERIN 0.4 MG SL SUBL
0.4000 mg | SUBLINGUAL_TABLET | SUBLINGUAL | Status: DC | PRN
  Administered 2016-04-23 (×3): 0.4 mg via SUBLINGUAL
  Filled 2016-04-23: qty 1

## 2016-04-23 MED ORDER — SODIUM CHLORIDE 0.9 % IV BOLUS (SEPSIS)
1000.0000 mL | Freq: Once | INTRAVENOUS | Status: AC
  Administered 2016-04-23: 1000 mL via INTRAVENOUS

## 2016-04-23 NOTE — ED Notes (Signed)
Paged [facility] for admission

## 2016-04-23 NOTE — ED Notes (Signed)
[facility] here for transport at this time.  

## 2016-04-23 NOTE — ED Notes (Signed)
[facility] here to transport pt at this time.

## 2016-04-23 NOTE — Progress Notes (Signed)
Called regarding transfer/admission of patient by Arlean Hopping at 7:50PM.  50 yo M with previous HTN untreated, possible TIA in past, no previous heart history or family history of vascular disease presents with substernal chest pressure since 9AM this morning with tingling in left arm.  D-dimer negative.  Troponin 0.03 and ECG with non-specific changes.  EDP spoke with [facility] who recommended starting heparin.  Patient's pain improved with morphine, but still present.    BP 116/78   Pulse 96   Temp 98.4 F (36.9 C)   Resp 19   Ht 5\' 10"  (1.778 m)   Wt 108.9 kg (240 lb)   SpO2 94%   BMI 34.44 kg/m   BMP Latest Ref Rng & Units 04/23/2016 02/16/2012 02/15/2012  Glucose 65 - 99 mg/dL 121(H) 100(H) 93  BUN 6 - 20 mg/dL 17 10 17   Creatinine 0.61 - 1.24 mg/dL 1.18 1.04 1.00  Sodium 135 - 145 mmol/L 137 141 140  Potassium 3.5 - 5.1 mmol/L 3.2(L) 4.1 3.6  Chloride 101 - 111 mmol/L 104 107 108  CO2 22 - 32 mmol/L 25 27 24   Calcium 8.9 - 10.3 mg/dL 8.9 8.5 8.1(L)   I recommend admission to stepdown given patient with heparin drip and active chest pain.  As we do not have stepdown beds available, EDP will consult outside hospital for admission for suspected ACS.

## 2016-04-23 NOTE — ED Provider Notes (Signed)
[facility] Provider Note   CSN: [id number] Arrival date & time: 04/23/16  1743  By signing my name below, I, Emmanuella Mensah, attest that this documentation has been prepared under the direction and in the presence of Letisia Schwalb, PA-C. Electronically Signed: Judithann Sauger, ED Scribe. 04/23/16. 6:32 PM.    History   Chief Complaint Chief Complaint  Patient presents with  . Chest Pain    HPI Comments: Harry Arnold is a 50 y.o. male with a hx of hypertension who presents to the Emergency Department complaining of ongoing 9/10 heavy central chest pain onset 9 am today. He reports associated nausea, dizziness, diaphoresis, left arm tingling, and constant numbness to his lower jaw. He notes that he currently has SOB, mild blurred vision with difficulty focusing, and nausea.   Pt currently wears glasses and he states that he recently had them replaced and went several days without them. No alleviating factors noted. Pt has not tried any medications PTA.   He reports a hx of possible TIA with similar symptoms that occurred last year where he was admitted for 3 days. He adds that he had a syncopal episode with his TIA at that time. He reports that he has been having frequent falls within the last month after feeling lightheaded. He denies a hx of cardiac issues, PE/DVT, or seizures. 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 He denies that he is a current smoker. Pt currently takes a daily aspirin and denies any other medications or any anti-coagulant use. He also denies any vomiting, fever, chills, diarrhea, cough, decreased appetite, dysuria, or any other complaints. Is supposed to be on HTN meds, but states he can't afford to see a PCP to have these medications prescribed.   No PCP   The history is provided by the patient. No language interpreter was used.    Past Medical History:  Diagnosis Date  . Diverticulitis   . GERD (gastroesophageal reflux  disease)   . Hypertension   . Kidney stones     Patient Active Problem List   Diagnosis Date Noted  . Colitis 02/15/2012  . Abdominal pain, acute 02/15/2012    Past Surgical History:  Procedure Laterality Date  . testicular cancer         Home Medications    Prior to Admission medications   Medication Sig Start Date End Date Taking? Authorizing Provider  aspirin 81 MG tablet Take 81 mg by mouth daily.    Historical Provider, MD  fish oil-omega-3 fatty acids 1000 MG capsule Take 3 g by mouth daily.    Historical Provider, MD  HYDROcodone-acetaminophen (NORCO/VICODIN) 5-325 MG per tablet Take 2 tablets by mouth every 4 (four) hours as needed for pain. 08/16/12   Leonard Schwartz, MD  oxyCODONE-acetaminophen (PERCOCET/ROXICET) 5-325 MG tablet Take 1-2 tablets by mouth every 4 (four) hours as needed for moderate pain or severe pain. 12/22/15   Harvel Quale, MD    Family History History reviewed. No pertinent family history.  Social History Social History  Substance Use Topics  . Smoking status: Never Smoker  . Smokeless tobacco: Not on file  . Alcohol use No     Allergies   Ibuprofen and Flexeril [cyclobenzaprine hcl]   Review of Systems Review of Systems  Constitutional: Negative for chills and fever.  Respiratory: Positive for shortness of breath. Negative for cough.   Cardiovascular: Positive for chest pain.  Gastrointestinal: Positive for nausea. Negative for diarrhea and  vomiting.  Neurological: Positive for dizziness.  All other systems reviewed and are negative.    Physical Exam Updated Vital Signs BP 157/94   Pulse 104   Temp 98.4 F (36.9 C)   Resp 24   Ht 5\' 10"  (1.778 m)   Wt 240 lb (108.9 kg)   SpO2 99%   BMI 34.44 kg/m   Physical Exam  Constitutional: He is oriented to person, place, and time. He appears well-developed and well-nourished. No distress.  HENT:  Head: Normocephalic and atraumatic.  Mouth/Throat: Oropharynx is clear and  moist.  Eyes: Conjunctivae and EOM are normal. Pupils are equal, round, and reactive to light.  Neck: Normal range of motion. Neck supple.  Cardiovascular: Normal rate, regular rhythm, normal heart sounds and intact distal pulses.   Pulmonary/Chest: Effort normal and breath sounds normal. No respiratory distress.  Abdominal: Soft. There is no tenderness. There is no guarding.  Musculoskeletal: He exhibits no edema or tenderness.  Lymphadenopathy:    He has no cervical adenopathy.  Neurological: He is alert and oriented to person, place, and time. He has normal reflexes.  No sensory deficits. Strength 5/5 in all extremities. No gait disturbance. Coordination intact. Cranial nerves III-XII grossly intact. No facial droop.    Skin: Skin is warm and dry. He is not diaphoretic.  Psychiatric: He has a normal mood and affect. His behavior is normal.  Nursing note and vitals reviewed.    ED Treatments / Results  DIAGNOSTIC STUDIES: Oxygen Saturation is 99% on RA, normal by my interpretation.    COORDINATION OF CARE: 6:29 PM- Pt advised of plan for treatment and pt agrees. Pt informed of his x-ray results. He will receive lab work and chest x-ray. He will also receive IV fluids, Zofran, Morphine, and Zofran. Recommended to establish a PCP for long term evaluation and treatment.   7:25 PM - Pt's pain has decreased to 4/10. He denies other symptoms.    Labs (all labs ordered are listed, but only abnormal results are displayed) Labs Reviewed  TROPONIN I - Abnormal; Notable for the following:       Result Value   Troponin I 0.03 (*)    All other components within normal limits  COMPREHENSIVE METABOLIC PANEL - Abnormal; Notable for the following:    Potassium 3.2 (*)    Glucose, Bld 121 (*)    All other components within normal limits  CBC WITH DIFFERENTIAL/PLATELET  D-DIMER, QUANTITATIVE (NOT AT [facility])  URINALYSIS, ROUTINE W REFLEX MICROSCOPIC (NOT AT [facility])  URINE RAPID DRUG SCREEN, HOSP  PERFORMED  PROTIME-INR  APTT  MAGNESIUM  TROPONIN I  TROPONIN I    EKG  EKG Interpretation  Date/Time:  Wednesday April 23 2016 17:52:37 EDT Ventricular Rate:  100 PR Interval:    QRS Duration: 90 QT Interval:  338 QTC Calculation: 436 R Axis:   70 Text Interpretation:  Sinus tachycardia No old tracing to compare Confirmed by [facility] MD, Corene Cornea [phone]) on 04/23/2016 6:02:50 PM       Radiology Dg Chest 2 View  Result Date: 04/23/2016 CLINICAL DATA:  Chest pain and shortness of breath. EXAM: CHEST  2 VIEW COMPARISON:  08/16/2012 FINDINGS: The lungs are clear wiithout focal pneumonia, edema, pneumothorax or pleural effusion. The cardiopericardial silhouette is within normal limits for size. The visualized bony structures of the thorax are intact. Telemetry leads overlie the chest. IMPRESSION: No active cardiopulmonary disease. Electronically Signed   By: Misty Stanley M.D.   On: 04/23/2016 18:21  Procedures Procedures (including critical care time)  CRITICAL CARE Performed by: Klye Besecker C Nial Hawe Total critical care time: 60 minutes Critical care time was exclusive of separately billable procedures and treating other patients. Critical care was necessary to treat or prevent imminent or life-threatening deterioration. Critical care was time spent personally by me on the following activities: development of treatment plan with patient and/or surrogate as well as nursing, discussions with consultants, evaluation of patient's response to treatment, examination of patient, obtaining history from patient or surrogate, ordering and performing treatments and interventions, ordering and review of laboratory studies, ordering and review of radiographic studies, pulse oximetry and re-evaluation of patient's condition.  Medications Ordered in ED Medications  0.9 %  sodium chloride infusion (not administered)  nitroGLYCERIN (NITROSTAT) SL tablet 0.4 mg (0.4 mg Sublingual Given 04/23/16 2011)    ondansetron (ZOFRAN) injection 4 mg (4 mg Intravenous Given 04/23/16 1838)  sodium chloride 0.9 % bolus 1,000 mL (1,000 mLs Intravenous New Bag/Given 04/23/16 1838)  morphine 4 MG/ML injection 4 mg (4 mg Intravenous Given 04/23/16 1839)  aspirin chewable tablet 324 mg (324 mg Oral Given 04/23/16 1839)  potassium chloride SA (K-DUR,KLOR-CON) CR tablet 40 mEq (40 mEq Oral Given 04/23/16 1902)  morphine 4 MG/ML injection 4 mg (4 mg Intravenous Given 04/23/16 2003)  heparin injection 4,000 Units (4,000 Units Intravenous Given 04/23/16 2004)   Orthostatic VS for the past 24 hrs:  BP- Lying Pulse- Lying BP- Sitting Pulse- Sitting BP- Standing at 0 minutes Pulse- Standing at 0 minutes  04/23/16 1851 138/86 91 (!) 142/91 102 (!) 125/92 106      Initial Impression / Assessment and Plan / ED Course  Arlean Hopping, PA-C has reviewed the triage vital signs and the nursing notes.  Pertinent labs & imaging results that were available during my care of the patient were reviewed by me and considered in my medical decision making (see chart for details).  Clinical Course  Value Comment By Time  Troponin I: (!!) 0.03 (Reviewed) Merrily Pew, MD 08/16 1901  Troponin I: (!!) 0.03 (Reviewed) Merrily Pew, MD 08/16 1902    Elisabeth Most presents with classic chest pain beginning this morning at around 9 AM.  Findings and plan of care discussed with Merrily Pew, MD. Dr. Dayna Barker personally evaluated and examined this patient.  Patient with classic chest pain and slightly elevated troponin. Troponin did not seem to trend upward while here in the [facility] ED. Patient remained hemodynamically stable and pain improved. HEART score is 4, indicating moderate risk for a cardiac event. Wells criteria score is 1.5, indicating low risk for PE. Patient admitted to stepdown at [facility].  See below for detailed patient care timeline: 7:35 PM Spoke with Dr. Meda Coffee, cardiologist, who agreed to consult on this patient. Requests  heparin be started. Requests admission via medicine due to the patient's complaints of numbness. 7:47 PM Spoke with Dr. Loleta Books States that the patient will need to be admitted to stepdown, however, states that [facility] has no stepdown beds available and this patient should not be accepted to telemetry. Chrys Racer confirms that [facility] has no stepdown or telemetry beds available. Will have to call the area hospitals to see if they can accept the patient.  8:13 PM Spoke with Dr. Will Bonnet, cardiologist at [facility], who states that if the patient is accepted into the ED, he will consult on him. Does not know if they have available stepdown beds. Will have nursing manager call me back. 9:33  PM Spoke with Dr. Gwendolyn Fill, cardiologist at [facility], who states they have no tele beds available. If we decide to try back in the morning, she encouraged [place] to just call back. Patient requested to not go to [facility], if possible, claiming that this would be a significant financial hardship for himself and his wife. Now plan to check to see if patient can be transferred to [facility] ED to be observed in the ED. 9:50 PM Spoke with Dr. Radford Pax, cardiologist at [facility], who states they would be happy to consult on the patient in the ED. [facility] will try to get patient admitted to CCU. Representative from [facility] called back and states that the patient can now be admitted to stepdown under Dr. Loleta Books.  Vitals:   04/23/16 1749 04/23/16 1751 04/23/16 1752  BP:   157/94  Pulse:  104   Resp:  24   Temp:  98.4 F (36.9 C)   SpO2:  99%   Weight: 108.9 kg    Height: 5\' 10"  (1.778 m)     Vitals:   04/23/16 1900 04/23/16 1930 04/23/16 2000 04/23/16 2014  BP: 142/100 138/91 135/97 116/78  Pulse: 91 84 87 96  Resp: 13 21 14 19   Temp:      SpO2: 93% 97% 95% 94%  Weight:      Height:         Final Clinical Impressions(s) / ED Diagnoses   Final diagnoses:  Chest pain, unspecified chest pain type    New  Prescriptions New Prescriptions   No medications on file   I personally performed the services described in this documentation, which was scribed in my presence. The recorded information has been reviewed and is accurate.    Lorayne Bender, PA-C 04/23/16 [place], PA-C 04/24/16 0018    Merrily Pew, MD 04/24/16 [id number]

## 2016-04-23 NOTE — ED Notes (Signed)
Call from Lab troponin critical value 0.03, primary nurse K. Nori Riis RN notified

## 2016-04-23 NOTE — Progress Notes (Signed)
Spoke with [facility].  A bed has opened up on [address], capable of accommodating ongoing chest pain with heparin drip and possible ACS.  Patient has serial enzymes ordered.  Aspirin given.  Heparin ongoing.  Hospitalist will accept with Cardiology consultation tomorrow.

## 2016-04-23 NOTE — ED Notes (Signed)
Wife Harry Arnold taking husband Harry Arnold's belongings home and is going to pick up some personal items and will return shortly.

## 2016-04-23 NOTE — ED Triage Notes (Signed)
Pt c/o CP "all day"  Left arm tingling, SOB and nausea

## 2016-04-24 ENCOUNTER — Other Ambulatory Visit (HOSPITAL_COMMUNITY): Payer: Self-pay

## 2016-04-24 ENCOUNTER — Encounter (HOSPITAL_COMMUNITY): Payer: Self-pay | Admitting: General Practice

## 2016-04-24 ENCOUNTER — Encounter (HOSPITAL_COMMUNITY)
Admission: EM | Disposition: A | Discharge status: Home / Self Care | Payer: Self-pay | Source: Home / Self Care | Attending: Emergency Medicine

## 2016-04-24 DIAGNOSIS — I10 Essential (primary) hypertension: Secondary | ICD-10-CM

## 2016-04-24 DIAGNOSIS — R079 Chest pain, unspecified: Secondary | ICD-10-CM

## 2016-04-24 DIAGNOSIS — K219 Gastro-esophageal reflux disease without esophagitis: Secondary | ICD-10-CM

## 2016-04-24 DIAGNOSIS — I200 Unstable angina: Secondary | ICD-10-CM

## 2016-04-24 DIAGNOSIS — R071 Chest pain on breathing: Secondary | ICD-10-CM

## 2016-04-24 HISTORY — PX: CARDIAC CATHETERIZATION: SHX172

## 2016-04-24 LAB — CBC
HCT: 39.8 % (ref 39.0–52.0)
Hemoglobin: 12.9 g/dL — ABNORMAL LOW (ref 13.0–17.0)
MCH: 31.9 pg (ref 26.0–34.0)
MCHC: 32.4 g/dL (ref 30.0–36.0)
MCV: 98.3 fL (ref 78.0–100.0)
PLATELETS: 186 10*3/uL (ref 150–400)
RBC: 4.05 MIL/uL — ABNORMAL LOW (ref 4.22–5.81)
RDW: 13.3 % (ref 11.5–15.5)
WBC: 6 10*3/uL (ref 4.0–10.5)

## 2016-04-24 LAB — BASIC METABOLIC PANEL
ANION GAP: 6 (ref 5–15)
BUN: 17 mg/dL (ref 6–20)
CO2: 27 mmol/L (ref 22–32)
Calcium: 8.5 mg/dL — ABNORMAL LOW (ref 8.9–10.3)
Chloride: 104 mmol/L (ref 101–111)
Creatinine, Ser: 1.14 mg/dL (ref 0.61–1.24)
GFR calc Af Amer: >60 mL/min (ref >60)
GFR calc non Af Amer: >60 mL/min (ref >60)
GLUCOSE: 123 mg/dL — AB (ref 65–99)
POTASSIUM: 4.2 mmol/L (ref 3.5–5.1)
Sodium: 137 mmol/L (ref 135–145)

## 2016-04-24 LAB — LIPID PANEL
CHOL/HDL RATIO: 6.2 ratio
Cholesterol: 211 mg/dL — ABNORMAL HIGH (ref 0–200)
HDL: 34 mg/dL — AB (ref >40)
LDL CALC: 156 mg/dL — AB (ref 0–99)
Triglycerides: 107 mg/dL (ref <150)
VLDL: 21 mg/dL (ref 0–40)

## 2016-04-24 LAB — TROPONIN I

## 2016-04-24 LAB — MRSA PCR SCREENING: MRSA BY PCR: NEGATIVE

## 2016-04-24 LAB — HEPARIN LEVEL (UNFRACTIONATED)
HEPARIN UNFRACTIONATED: 0.36 [IU]/mL (ref 0.30–0.70)
HEPARIN UNFRACTIONATED: 0.43 [IU]/mL (ref 0.30–0.70)

## 2016-04-24 SURGERY — LEFT HEART CATH AND CORONARY ANGIOGRAPHY
Anesthesia: LOCAL

## 2016-04-24 MED ORDER — LIDOCAINE HCL (PF) 1 % IJ SOLN
INTRAMUSCULAR | Status: DC | PRN
  Administered 2016-04-24: 2 mL

## 2016-04-24 MED ORDER — FENTANYL CITRATE (PF) 100 MCG/2ML IJ SOLN
INTRAMUSCULAR | Status: DC | PRN
  Administered 2016-04-24: 50 ug via INTRAVENOUS

## 2016-04-24 MED ORDER — ACETAMINOPHEN 325 MG PO TABS: 650.0000 mg | ORAL_TABLET | ORAL | Status: DC | PRN

## 2016-04-24 MED ORDER — ASPIRIN 81 MG PO CHEW: 81.0000 mg | CHEWABLE_TABLET | ORAL | Status: DC

## 2016-04-24 MED ORDER — HEPARIN SODIUM (PORCINE) 1000 UNIT/ML IJ SOLN
INTRAMUSCULAR | Status: DC | PRN
  Administered 2016-04-24: 5000 [IU] via INTRAVENOUS

## 2016-04-24 MED ORDER — SODIUM CHLORIDE 0.9 % WEIGHT BASED INFUSION: 1.0000 mL/kg/h | INTRAVENOUS | Status: AC

## 2016-04-24 MED ORDER — ONDANSETRON HCL 4 MG/2ML IJ SOLN
4.0000 mg | Freq: Four times a day (QID) | INTRAMUSCULAR | Status: DC | PRN
  Administered 2016-04-24 (×2): 4 mg via INTRAVENOUS
  Filled 2016-04-24 (×2): qty 2

## 2016-04-24 MED ORDER — PANTOPRAZOLE SODIUM 40 MG PO TBEC
40.0000 mg | DELAYED_RELEASE_TABLET | Freq: Every day | ORAL | Status: DC
  Administered 2016-04-24 – 2016-04-25 (×2): 40 mg via ORAL
  Filled 2016-04-24 (×2): qty 1

## 2016-04-24 MED ORDER — SODIUM CHLORIDE 0.9% FLUSH: 3.0000 mL | Freq: Two times a day (BID) | INTRAVENOUS | Status: DC

## 2016-04-24 MED ORDER — SODIUM CHLORIDE 0.9 % WEIGHT BASED INFUSION: 1.0000 mL/kg/h | INTRAVENOUS | Status: DC

## 2016-04-24 MED ORDER — SODIUM CHLORIDE 0.9% FLUSH: 3.0000 mL | INTRAVENOUS | Status: DC | PRN

## 2016-04-24 MED ORDER — SODIUM CHLORIDE 0.9% FLUSH
3.0000 mL | Freq: Two times a day (BID) | INTRAVENOUS | Status: DC
  Administered 2016-04-24 – 2016-04-25 (×2): 3 mL via INTRAVENOUS

## 2016-04-24 MED ORDER — ATORVASTATIN CALCIUM 40 MG PO TABS
40.0000 mg | ORAL_TABLET | Freq: Every day | ORAL | Status: DC
  Administered 2016-04-24: 40 mg via ORAL
  Filled 2016-04-24: qty 1

## 2016-04-24 MED ORDER — SODIUM CHLORIDE 0.9 % IV SOLN: 250.0000 mL | INTRAVENOUS | Status: DC | PRN

## 2016-04-24 MED ORDER — MORPHINE SULFATE (PF) 4 MG/ML IV SOLN
4.0000 mg | INTRAVENOUS | Status: DC | PRN
  Administered 2016-04-24: 4 mg via INTRAVENOUS
  Filled 2016-04-24: qty 1

## 2016-04-24 MED ORDER — HEPARIN (PORCINE) IN NACL 2-0.9 UNIT/ML-% IJ SOLN
INTRAMUSCULAR | Status: DC | PRN
  Administered 2016-04-24: 1000 mL

## 2016-04-24 MED ORDER — IOPAMIDOL (ISOVUE-370) INJECTION 76%
INTRAVENOUS | Status: DC | PRN
  Administered 2016-04-24: 50 mL via INTRA_ARTERIAL

## 2016-04-24 MED ORDER — ONDANSETRON HCL 4 MG/2ML IJ SOLN: 4.0000 mg | Freq: Three times a day (TID) | INTRAMUSCULAR | Status: DC | PRN

## 2016-04-24 MED ORDER — ASPIRIN EC 81 MG PO TBEC
81.0000 mg | DELAYED_RELEASE_TABLET | Freq: Every day | ORAL | Status: DC
  Administered 2016-04-24 – 2016-04-25 (×2): 81 mg via ORAL
  Filled 2016-04-24 (×2): qty 1

## 2016-04-24 MED ORDER — SODIUM CHLORIDE 0.9 % WEIGHT BASED INFUSION
3.0000 mL/kg/h | INTRAVENOUS | Status: DC
  Administered 2016-04-24: 3 mL/kg/h via INTRAVENOUS

## 2016-04-24 MED ORDER — VERAPAMIL HCL 2.5 MG/ML IV SOLN
INTRAVENOUS | Status: DC | PRN
  Administered 2016-04-24: 10 mL via INTRA_ARTERIAL

## 2016-04-24 MED ORDER — GI COCKTAIL ~~LOC~~
30.0000 mL | Freq: Once | ORAL | Status: AC
  Administered 2016-04-24: 30 mL via ORAL
  Filled 2016-04-24: qty 30

## 2016-04-24 MED ORDER — HEPARIN (PORCINE) IN NACL 100-0.45 UNIT/ML-% IJ SOLN
1500.0000 [IU]/h | INTRAMUSCULAR | Status: DC
  Administered 2016-04-24: 1500 [IU]/h via INTRAVENOUS
  Filled 2016-04-24: qty 250

## 2016-04-24 MED ORDER — MIDAZOLAM HCL 2 MG/2ML IJ SOLN
INTRAMUSCULAR | Status: DC | PRN
  Administered 2016-04-24: 2 mg via INTRAVENOUS

## 2016-04-24 MED ORDER — ONDANSETRON HCL 4 MG/2ML IJ SOLN: 4.0000 mg | Freq: Four times a day (QID) | INTRAMUSCULAR | Status: DC | PRN

## 2016-04-24 SURGICAL SUPPLY — 12 items

## 2016-04-24 NOTE — Progress Notes (Signed)
Patient seen and examined  50yo male with a history of HTN and GERD who had been in his USOH until having sudden onset of chest pain 9/10 at 9am today.EKG was normal and initial Trop was slightly elevated at 0.03 x 2.  He continues to complain of chest pain.  Cardiology is   asked to evaluate further. D-dimer negative on admission. LDL 156, triglycerides 107. Urine drug screen negative.  NPO for  cath vs. Nuclear stress test, echo pending  Patient states that he was told that he is having a cardiac cath today

## 2016-04-24 NOTE — Progress Notes (Signed)
[place] for Heparin  Indication: chest pain/ACS  Allergies  Allergen Reactions  . Ibuprofen Swelling    Pt reports a swelling and rash reaction to gel-capsule coated ibuprofen  . Flexeril [Cyclobenzaprine Hcl] Rash    Patient Measurements: Height: 5\' 10"  (177.8 cm) Weight: 233 lb 8 oz (105.9 kg) IBW/kg (Calculated) : 73  Vital Signs: Temp: 98.6 F (37 C) (08/17 0916) Temp Source: Oral (08/17 0916) BP: 141/86 (08/17 0439) Pulse Rate: 74 (08/17 0439)  Labs:  Recent Labs  04/23/16 1800 04/24/16 0445 04/24/16 0447 04/24/16 0734 04/24/16 0830  HGB 13.9  --  12.9*  --   --   HCT 40.1  --  39.8  --   --   PLT 202  --  186  --   --   APTT 26  --   --   --   --   LABPROT 12.4  --   --   --   --   INR 0.92  --   --   --   --   HEPARINUNFRC  --   --   --  0.36  --   CREATININE 1.18  --  1.14  --   --   TROPONINI 0.03* <0.03  --   --  0.03*    Estimated Creatinine Clearance: 95.6 mL/min (by C-G formula based on SCr of 1.14 mg/dL).   Medical History: Past Medical History:  Diagnosis Date  . Diverticulitis   . GERD (gastroesophageal reflux disease)   . Hypertension   . Kidney stones     Assessment: 50 y/o M transfer from [facility] with chest pain, started on heparin.   Initial HL is therapeutic at 0.36 on heparin infusion at 1500 units/hr. Cards planning cath vs NST today. CBC stable   Goal of Therapy:  Heparin level 0.3-0.7 units/ml Monitor platelets by anticoagulation protocol: Yes   Plan:  -Continue heparin drip at 1500 units/hr -Confirmatory heparin level in 6 hours  -Daily CBC/HL   Vincenza Hews, PharmD, BCPS 04/24/2016, 9:42 AM Pager: [phone]

## 2016-04-24 NOTE — Consult Note (Signed)
Admit date: 04/23/2016 Referring Physician  Arlean Hopping, PA Primary Cardiologist  None Reason for Consultation  Chest pain  HPI: This is a 50yo male with a history of HTN and GERD who had been in his USOH until having sudden onset of chest pain 9/10 at 9am today.  He says that his left arm began to tingle and had numbness of his lower jaw.  He became diaphoretic, nauseated, dizzy and SOB. He says that the CP is sharp but has a tightness component to it as well.  It has been constant since this am and nothing makes it better.  He had similar pain a year ago and had a reportedly normal nuclear stress test at [facility].  Per records this was also in setting of possible TIA  with similar symptoms and had a syncopal episode at that time.  For the past month he has had frequent falls with lightheadedness.  He apparently has been diagnosed with moderate carotid artery disease and is on ASA.  He has a history of HTN but cannot afford his meds.  He presented to [facility] where EKG was normal and initial Trop was slightly elevated at 0.03 x 2.  He continues to complain of chest pain.  Cardiology is now asked to evaluate further.     PMH:   Past Medical History:  Diagnosis Date  . Diverticulitis   . GERD (gastroesophageal reflux disease)   . Hypertension   . Kidney stones      PSH:   Past Surgical History:  Procedure Laterality Date  . testicular cancer      Allergies:  Ibuprofen and Flexeril [cyclobenzaprine hcl] Prior to Admit Meds:   Prescriptions Prior to Admission  Medication Sig Dispense Refill Last Dose  . aspirin 81 MG tablet Take 81 mg by mouth daily.     . fish oil-omega-3 fatty acids 1000 MG capsule Take 3 g by mouth daily.   02/14/2012 at Unknown  . HYDROcodone-acetaminophen (NORCO/VICODIN) 5-325 MG per tablet Take 2 tablets by mouth every 4 (four) hours as needed for pain. 20 tablet 0   . oxyCODONE-acetaminophen (PERCOCET/ROXICET) 5-325 MG tablet Take 1-2 tablets by mouth every  4 (four) hours as needed for moderate pain or severe pain. 20 tablet 0    Fam HX:   History reviewed. No pertinent family history. Social HX:    Social History   Social History  . Marital status: Married    Spouse name: N/A  . Number of children: N/A  . Years of education: N/A   Occupational History  . Not on file.   Social History Main Topics  . Smoking status: Never Smoker  . Smokeless tobacco: Not on file  . Alcohol use No  . Drug use: No  . Sexual activity: Not on file   Other Topics Concern  . Not on file   Social History Narrative  . No narrative on file     ROS:  All 11 ROS were addressed and are negative except what is stated in the HPI  Physical Exam: Blood pressure 117/79, pulse 82, temperature 98.1 F (36.7 C), resp. rate 18, height 5\' 10"  (1.778 m), weight 240 lb (108.9 kg), SpO2 95 %.    General: Well developed, well nourished, in no acute distress Head: Eyes PERRLA, No xanthomas.   Normal cephalic and atramatic  Lungs:   Clear bilaterally to auscultation and percussion. Heart:   HRRR S1 S2 Pulses are 2+ & equal.  No carotid bruit. No JVD.  No abdominal bruits. No femoral bruits. Abdomen: Bowel sounds are positive, abdomen soft and non-tender without masses  Msk:  Back normal, normal gait. Normal strength and tone for age. Extremities:   No clubbing, cyanosis or edema.  DP +1 Neuro: Alert and oriented X 3. Psych:  Good affect, responds appropriately    Labs:   Lab Results  Component Value Date   WBC 7.5 04/23/2016   HGB 13.9 04/23/2016   HCT 40.1 04/23/2016   MCV 93.5 04/23/2016   PLT 202 04/23/2016    Recent Labs Lab 04/23/16 1800  NA 137  K 3.2*  CL 104  CO2 25  BUN 17  CREATININE 1.18  CALCIUM 8.9  PROT 6.8  BILITOT 0.8  ALKPHOS 58  ALT 35  AST 30  GLUCOSE 121*   No results found for: PTT Lab Results  Component Value Date   INR 0.92 04/23/2016   Lab Results  Component Value Date   TROPONINI 0.03 ([place])  04/24/2016    No results found for: CHOL No results found for: HDL No results found for: LDLCALC No results found for: TRIG No results found for: CHOLHDL No results found for: LDLDIRECT    Radiology:  Dg Chest 2 View  Result Date: 04/23/2016 CLINICAL DATA:  Chest pain and shortness of breath. EXAM: CHEST  2 VIEW COMPARISON:  08/16/2012 FINDINGS: The lungs are clear wiithout focal pneumonia, edema, pneumothorax or pleural effusion. The cardiopericardial silhouette is within normal limits for size. The visualized bony structures of the thorax are intact. Telemetry leads overlie the chest. IMPRESSION: No active cardiopulmonary disease. Electronically Signed   By: Misty Stanley M.D.   On: 04/23/2016 18:21    EKG:  NSR with no ST changes  ASSESSMENT/PLAN: 1.  Chest pain that is somewhat atypical for angina in that it is sharp and stabbing but also has a typical component of tightness with associated nausea, diaphoresis, nausea and numbness of jaw.  It is  atypical in that it has been constant for over 12 hours with a trop of only 0.03.  He has never smoked and his only CRF is HTN.  He has no family history of CAD.  Will cycle cardiac enzymes.  Check 2D echo in am. Make NPO after MN for cath vs. Nuclear stress test.  Of note, he had a stress test a year ago at [place] [facility] due to similar pain that he states was normal.  Will treat pain with MSO4 and NTG gtt.  Start IV Heparin gtt per pharmacy.  Continue ASA.  Will start statin for now.  2.  HTN - BP controlled  3.  GERD - start PPI.  I am going to give him a GI cocktail to see if it helps with his pain.   4.  Moderate carotid artery stenosis - continue ASA, check FLP in am.    Fransico Him, MD  04/24/2016  12:07 AM

## 2016-04-24 NOTE — Progress Notes (Signed)
Continues to have CP.  Trop minimally elevated but due to continued CP recommend proceeding with cardiac cath.  He is on for cath later this afternoon.  He remains NPO.

## 2016-04-24 NOTE — Progress Notes (Signed)
ANTICOAGULATION CONSULT NOTE - FOLLOW UP    HL = 0.43 (goal 0.3 - 0.7 units/mL) Heparin dosing weight = 96 kg   Assessment: 50 YOM continues on IV heparin for ACS.  Heparin level is therapeutic; no bleeding reported.  Current plan is for patient to go to cath today.   Plan: - Continue heparin gtt 1500 units/hr - F/U post cath    Rondle Lohse D. Mina Marble, PharmD, BCPS 04/24/2016, 4:50 PM

## 2016-04-24 NOTE — Interval H&P Note (Signed)
Cath Lab Visit (complete for each Cath Lab visit)  Clinical Evaluation Leading to the Procedure:   ACS: Yes.    Non-ACS:    Anginal Classification: CCS IV  Anti-ischemic medical therapy: Minimal Therapy (1 class of medications)  Non-Invasive Test Results: No non-invasive testing performed  Prior CABG: No previous CABG      History and Physical Interval Note:  04/24/2016 6:15 PM  Harry Arnold  has presented today for surgery, with the diagnosis of Chest Pain, Positive Troponin   The various methods of treatment have been discussed with the patient and family. After consideration of risks, benefits and other options for treatment, the patient has consented to  Procedure(s): Left Heart Cath and Coronary Angiography (N/A) as a surgical intervention .  The patient's history has been reviewed, patient examined, no change in status, stable for surgery.  I have reviewed the patient's chart and labs.  Questions were answered to the patient's satisfaction.     Larae Grooms

## 2016-04-24 NOTE — H&P (Signed)
History and Physical  Patient Name: Harry Arnold     [id number]    DOB: February 21, 1966    DOA: 04/23/2016 PCP: No PCP Per Patient   Patient coming from: Home     Chief Complaint: Chest pain  HPI: Harry Arnold is a 50 y.o. male with a past medical history significant for HTN who presents with chest pain.  The patient was in his usual state of health until this morning around 9AM when he had onset of substernal chest discomfort.  This was pressure-like in character with occasional "stabbing" sensations.  He worked his normal shift at work, but felt bad throughout the day, sensing shortness of breath and nausea, and clamminess along with the chest discomfort.  In the evening when he got home, his wife brought him to the ER.  The pain was not particularly worse with exertion, not worse with food, not improved with taking a bath.  ED course: -Afebrile, heart rate 104, respirations initially 24, slowed afterwards, blood pressure initially slightly elevated, normalized afterwards, no hypoxia -Initial ECG showed NSR and troponin was minimally elevated at 0.03 ng/mL. -Na 137, K 3.2, Cr 1.18 (baseline), WBC 7.5, Hgb 13.9, mag normal, UDS negative -D-dimer negative -The case was discussed with Cardiology who recommended initiating heparin and TRH was asked to admit for observation, serial troponins and risk stratification  He reports no family history of CV disease.  He had chest pain in the past, reports a stress test at [facility] a year ago he thinks, although these records are not available in [place].      Review of Systems:  All other systems negative except as just noted or noted in the history of present illness.  Past Medical History:  Diagnosis Date  . Diverticulitis   . GERD (gastroesophageal reflux disease)   . Hypertension   . Kidney stones     Past Surgical History:  Procedure Laterality Date  . testicular cancer      Social History: Patient lives with his  wife.  Patient walks unassisted.  He works at [employer].  he does not smoke or use alcohol.    Allergies  Allergen Reactions  . Ibuprofen Swelling    Pt reports a swelling and rash reaction to gel-capsule coated ibuprofen  . Flexeril [Cyclobenzaprine Hcl] Rash    Family history: No family history of CV disease.  Prior to Admission medications   Medication Sig Start Date End Date Taking? Authorizing Provider  aspirin 81 MG tablet Take 81 mg by mouth daily.    Historical Provider, MD  fish oil-omega-3 fatty acids 1000 MG capsule Take 3 g by mouth daily.    Historical Provider, MD  HYDROcodone-acetaminophen (NORCO/VICODIN) 5-325 MG per tablet Take 2 tablets by mouth every 4 (four) hours as needed for pain. 08/16/12   Leonard Schwartz, MD  oxyCODONE-acetaminophen (PERCOCET/ROXICET) 5-325 MG tablet Take 1-2 tablets by mouth every 4 (four) hours as needed for moderate pain or severe pain. 12/22/15   Harvel Quale, MD       Physical Exam: BP 114/89 (BP Location: Left Arm)   Pulse 65   Temp 98.2 F (36.8 C) (Oral)   Resp 13   Ht 5\' 10"  (1.778 m)   Wt 105.2 kg (232 lb)   SpO2 97%   BMI 33.29 kg/m  General appearance: Well-developed, adult male, alert and in no acute distress. Sleeping but rouses easily, oriented.   Eyes: Anicteric, conjunctiva pink, lids and lashes normal.     ENT:  No nasal deformity, discharge, or epistaxis.  OP moist without lesions.   Skin: Warm and dry.   Cardiac: RRR, nl S1-S2, no murmurs appreciated.  Capillary refill is brisk.  JVP not visible.  No LE edema.  Radial and DP pulses 2+ and symmetric.  No carotid bruits. Respiratory: Normal respiratory rate and rhythm.  CTAB without rales or wheezes. GI: Abdomen soft without rigidity.  No TTP. No ascites, distension.   MSK: No deformities or effusions.   Pain reproduced with palpation of precordium.  No pain with arm movement. Neuro: Sensorium intact and responding to questions, attention normal.  Speech is fluent.   Moves all extremities equally and with normal coordination.    Psych: Behavior appropriate.  Affect normal.  No evidence of aural or visual hallucinations or delusions.       Labs on Admission:  The metabolic panel shows hypokalemia.  Normal renal function. Transaminases and bilirubin normal. The complete blood count shows no leukocytosis, anemia or thrombocytopenia. The initial troponin is minimally elevated.  Radiological Exams on Admission: Personally reviewed: Dg Chest 2 View  Result Date: 04/23/2016 CLINICAL DATA:  Chest pain and shortness of breath. EXAM: CHEST  2 VIEW COMPARISON:  08/16/2012 FINDINGS: The lungs are clear wiithout focal pneumonia, edema, pneumothorax or pleural effusion. The cardiopericardial silhouette is within normal limits for size. The visualized bony structures of the thorax are intact. Telemetry leads overlie the chest. IMPRESSION: No active cardiopulmonary disease. Electronically Signed   By: Misty Stanley M.D.   On: 04/23/2016 18:21    EKG: Independently reviewed. Rate 100 on initial ECG, 59 on repeat.  QTc normal on both.  Normal sinus rhythm without ST or TW changes.    Assessment/Plan 1. Chest pain: -Serial troponins are ordered -Place in stepdown -Continue heparin gtt -Echocardiogram ordered -Consult to cardiology, appreciate recommendations -Start statin and aspirin -Check lipid panel -Start PPI -NPO in AM   2. Hypokalemia:  This is new.   -Repleted in the ER  3. HTN:  Previously on antihypertensive, not currently because of cost issues.  No current PCP.  BP WNL currently.     DVT prophylaxis: Heparin Diet: NPO after 4am for anticipated stress testing Code Status: Full  Family Communication: Wife at bedside  Disposition Plan: Anticipate overnight observation for arrhythmia on telemetry, serial troponins and subsequent risk stratification by Cardiology.  If testing negative, home after. Consults called: Cardiology, Dr.  Radford Pax Admission status: Stepdown, OBS   Medical decision making: Patient seen at 3:28 AM on 04/24/2016.  The patient was discussed with Arlean Hopping, PA-C. What exists of the patient's chart was reviewed in depth.  Clinical condition: stable.      Edwin Dada [facility] Pager [phone]

## 2016-04-24 NOTE — Progress Notes (Signed)
Report received in patient's room via Sheppard Evens RN using SBAR format, reviewed orders, labs, VS, meds and patient's general condition, while assessing patient's right radial site, the MD walked in and also assessed and updated me on the procedure, will continue to monitor.

## 2016-04-24 NOTE — H&P (View-Only) (Signed)
Continues to have CP.  Trop minimally elevated but due to continued CP recommend proceeding with cardiac cath.  He is on for cath later this afternoon.  He remains NPO.

## 2016-04-24 NOTE — Progress Notes (Signed)
ANTICOAGULATION CONSULT NOTE - Initial Consult  Pharmacy Consult for Heparin  Indication: chest pain/ACS  Allergies  Allergen Reactions  . Ibuprofen Swelling    Pt reports a swelling and rash reaction to gel-capsule coated ibuprofen  . Flexeril [Cyclobenzaprine Hcl] Rash    Patient Measurements: Height: 5\' 10"  (177.8 cm) Weight: 232 lb (105.2 kg) IBW/kg (Calculated) : 73  Vital Signs: Temp: 98.2 F (36.8 C) (08/17 0014) Temp Source: Oral (08/17 0014) BP: 114/89 (08/17 0014) Pulse Rate: 65 (08/17 0014)  Labs:  Recent Labs  04/23/16 1800 04/24/16 0830  HGB 13.9  --   HCT 40.1  --   PLT 202  --   APTT 26  --   LABPROT 12.4  --   INR 0.92  --   CREATININE 1.18  --   TROPONINI 0.03* 0.03*    Estimated Creatinine Clearance: 92 mL/min (by C-G formula based on SCr of 1.18 mg/dL).   Medical History: Past Medical History:  Diagnosis Date  . Diverticulitis   . GERD (gastroesophageal reflux disease)   . Hypertension   . Kidney stones     Assessment: 50 y/o M transfer from [facility] with chest pain, starting heparin per Rx, cath vs NST today, labs/PTA meds reviewed.   Goal of Therapy:  Heparin level 0.3-0.7 units/ml Monitor platelets by anticoagulation protocol: Yes   Plan:  -Heparin 4000 units BOLUS completed at [facility] -Start heparin drip at 1500 units/hr -0800 HL -Daily CBC/HL -Monitor for bleeding  Narda Bonds 04/24/2016,12:26 AM

## 2016-04-25 ENCOUNTER — Observation Stay (HOSPITAL_COMMUNITY): Payer: Self-pay

## 2016-04-25 ENCOUNTER — Encounter (HOSPITAL_COMMUNITY): Payer: Self-pay | Admitting: Interventional Cardiology

## 2016-04-25 ENCOUNTER — Observation Stay (HOSPITAL_BASED_OUTPATIENT_CLINIC_OR_DEPARTMENT_OTHER): Payer: Self-pay

## 2016-04-25 DIAGNOSIS — R0789 Other chest pain: Secondary | ICD-10-CM

## 2016-04-25 DIAGNOSIS — R079 Chest pain, unspecified: Secondary | ICD-10-CM

## 2016-04-25 DIAGNOSIS — R072 Precordial pain: Secondary | ICD-10-CM

## 2016-04-25 LAB — CBC
HCT: 41.2 % (ref 39.0–52.0)
Hemoglobin: 13.1 g/dL (ref 13.0–17.0)
MCH: 31.3 pg (ref 26.0–34.0)
MCHC: 31.8 g/dL (ref 30.0–36.0)
MCV: 98.6 fL (ref 78.0–100.0)
PLATELETS: 183 10*3/uL (ref 150–400)
RBC: 4.18 MIL/uL — ABNORMAL LOW (ref 4.22–5.81)
RDW: 13 % (ref 11.5–15.5)
WBC: 7 10*3/uL (ref 4.0–10.5)

## 2016-04-25 LAB — COMPREHENSIVE METABOLIC PANEL
ALT: 32 U/L (ref 17–63)
ANION GAP: 6 (ref 5–15)
AST: 28 U/L (ref 15–41)
Albumin: 3.5 g/dL (ref 3.5–5.0)
Alkaline Phosphatase: 52 U/L (ref 38–126)
BUN: 16 mg/dL (ref 6–20)
CHLORIDE: 106 mmol/L (ref 101–111)
CO2: 27 mmol/L (ref 22–32)
CREATININE: 1.34 mg/dL — AB (ref 0.61–1.24)
Calcium: 8.3 mg/dL — ABNORMAL LOW (ref 8.9–10.3)
Glucose, Bld: 104 mg/dL — ABNORMAL HIGH (ref 65–99)
Potassium: 4.4 mmol/L (ref 3.5–5.1)
SODIUM: 139 mmol/L (ref 135–145)
Total Bilirubin: 0.5 mg/dL (ref 0.3–1.2)
Total Protein: 6 g/dL — ABNORMAL LOW (ref 6.5–8.1)

## 2016-04-25 LAB — TROPONIN I: Troponin I: <0.03 ng/mL (ref <0.03)

## 2016-04-25 LAB — ECHOCARDIOGRAM COMPLETE
Height: 70 in
Weight: 3702.4 oz

## 2016-04-25 MED ORDER — ATORVASTATIN CALCIUM 40 MG PO TABS: 40.0000 mg | ORAL_TABLET | Freq: Every day | ORAL | 1 refills | Status: DC

## 2016-04-25 MED ORDER — PANTOPRAZOLE SODIUM 40 MG PO TBEC: 40.0000 mg | DELAYED_RELEASE_TABLET | Freq: Every day | ORAL | 0 refills | Status: DC

## 2016-04-25 NOTE — Discharge Instructions (Signed)
Chest Pain Observation °It is often hard to give a specific diagnosis for the cause of chest pain. Among other possibilities your symptoms might be caused by inadequate oxygen delivery to your heart (angina). Angina that is not treated or evaluated can lead to a heart attack (myocardial infarction) or death. °Blood tests, electrocardiograms, and X-rays may have been done to help determine a possible cause of your chest pain. After evaluation and observation, your health care provider has determined that it is unlikely your pain was caused by an unstable condition that requires hospitalization. However, a full evaluation of your pain may need to be completed, with additional diagnostic testing as directed. It is very important to keep your follow-up appointments. Not keeping your follow-up appointments could result in permanent heart damage, disability, or death. If there is any problem keeping your follow-up appointments, you must call your health care provider. °HOME CARE INSTRUCTIONS  °Due to the slight chance that your pain could be angina, it is important to follow your health care provider's treatment plan and also maintain a healthy lifestyle: °· Maintain or work toward achieving a healthy weight. °· Stay physically active and exercise regularly. °· Decrease your salt intake. °· Eat a balanced, healthy diet. Talk to a dietitian to learn about heart-healthy foods. °· Increase your fiber intake by including whole grains, vegetables, fruits, and nuts in your diet. °· Avoid situations that cause stress, anger, or depression. °· Take medicines as advised by your health care provider. Report any side effects to your health care provider. Do not stop medicines or adjust the dosages on your own. °· Quit smoking. Do not use nicotine patches or gum until you check with your health care provider. °· Keep your blood pressure, blood sugar, and cholesterol levels within normal limits. °· Limit alcohol intake to no more than  1 drink per day for women who are not pregnant and 2 drinks per day for men. °· Do not abuse drugs. °SEEK IMMEDIATE MEDICAL CARE IF: °You have severe chest pain or pressure which may include symptoms such as: °· You feel pain or pressure in your arms, neck, jaw, or back. °· You have severe back or abdominal pain, feel sick to your stomach (nauseous), or throw up (vomit). °· You are sweating profusely. °· You are having a fast or irregular heartbeat. °· You feel short of breath while at rest. °· You notice increasing shortness of breath during rest, sleep, or with activity. °· You have chest pain that does not get better after rest or after taking your usual medicine. °· You wake from sleep with chest pain. °· You are unable to sleep because you cannot breathe. °· You develop a frequent cough or you are coughing up blood. °· You feel dizzy, faint, or experience extreme fatigue. °· You develop severe weakness, dizziness, fainting, or chills. °Any of these symptoms may represent a serious problem that is an emergency. Do not wait to see if the symptoms will go away. Call your local emergency services (911 in the [place] Do not drive yourself to the hospital. °MAKE SURE YOU: °· Understand these instructions. °· Will watch your condition. °· Will get help right away if you are not doing well or get worse. °  °This information is not intended to replace advice given to you by your health care provider. Make sure you discuss any questions you have with your health care provider. °  °Document Released: 09/27/2010 Document Revised: 08/30/2013 Document Reviewed: 02/24/2013 °Elsevier Interactive Patient   Education 2016 [employer]. Heart-Healthy Eating Plan Many factors influence your heart health, including eating and exercise habits. Heart (coronary) risk increases with abnormal blood fat (lipid) levels. Heart-healthy meal planning includes limiting unhealthy fats, increasing healthy fats, and making other small dietary  changes. This includes maintaining a healthy body weight to help keep lipid levels within a normal range. WHAT IS MY PLAN?  Your health care provider recommends that you:  Get no more than _________% of the total calories in your daily diet from fat.  Limit your intake of saturated fat to less than _________% of your total calories each day.  Limit the amount of cholesterol in your diet to less than _________ mg per day. WHAT TYPES OF FAT SHOULD I CHOOSE?  Choose healthy fats more often. Choose monounsaturated and polyunsaturated fats, such as olive oil and canola oil, flaxseeds, walnuts, almonds, and seeds.  Eat more omega-3 fats. Good choices include salmon, mackerel, sardines, tuna, flaxseed oil, and ground flaxseeds. Aim to eat fish at least two times each week.  Limit saturated fats. Saturated fats are primarily found in animal products, such as meats, butter, and cream. Plant sources of saturated fats include palm oil, palm kernel oil, and coconut oil.  Avoid foods with partially hydrogenated oils in them. These contain trans fats. Examples of foods that contain trans fats are stick margarine, some tub margarines, cookies, crackers, and other baked goods. WHAT GENERAL GUIDELINES DO I NEED TO FOLLOW?  Check food labels carefully to identify foods with trans fats or high amounts of saturated fat.  Fill one half of your plate with vegetables and green salads. Eat 4-5 servings of vegetables per day. A serving of vegetables equals 1 cup of raw leafy vegetables,  cup of raw or cooked cut-up vegetables, or  cup of vegetable juice.  Fill one fourth of your plate with whole grains. Look for the word "whole" as the first word in the ingredient list.  Fill one fourth of your plate with lean protein foods.  Eat 4-5 servings of fruit per day. A serving of fruit equals one medium whole fruit,  cup of dried fruit,  cup of fresh, frozen, or canned fruit, or  cup of 100% fruit juice.  Eat  more foods that contain soluble fiber. Examples of foods that contain this type of fiber are apples, broccoli, carrots, beans, peas, and barley. Aim to get 20-30 g of fiber per day.  Eat more home-cooked food and less restaurant, buffet, and fast food.  Limit or avoid alcohol.  Limit foods that are high in starch and sugar.  Avoid fried foods.  Cook foods by using methods other than frying. Baking, boiling, grilling, and broiling are all great options. Other fat-reducing suggestions include:  Removing the skin from poultry.  Removing all visible fats from meats.  Skimming the fat off of stews, soups, and gravies before serving them.  Steaming vegetables in water or broth.  Lose weight if you are overweight. Losing just 5-10% of your initial body weight can help your overall health and prevent diseases such as diabetes and heart disease.  Increase your consumption of nuts, legumes, and seeds to 4-5 servings per week. One serving of dried beans or legumes equals  cup after being cooked, one serving of nuts equals 1 ounces, and one serving of seeds equals  ounce or 1 tablespoon.  You may need to monitor your salt (sodium) intake, especially if you have high blood pressure. Talk with your health care  provider or dietitian to get more information about reducing sodium. WHAT FOODS CAN I EAT? Grains Breads, including [place], white, pita, wheat, raisin, rye, oatmeal, and [place]. Tortillas that are neither fried nor made with lard or trans fat. Low-fat rolls, including hotdog and hamburger buns and English muffins. Biscuits. Muffins. Waffles. Pancakes. Light popcorn. Whole-grain cereals. Flatbread. Melba toast. Pretzels. Breadsticks. Rusks. Low-fat snacks and crackers, including oyster, saltine, matzo, graham, animal, and rye. Rice and pasta, including brown rice and those that are made with whole wheat. Vegetables All vegetables. Fruits All fruits, but limit coconut. Meats and Other  Protein Sources Lean, well-trimmed beef, veal, pork, and lamb. Chicken and [place] without skin. All fish and shellfish. Wild duck, rabbit, pheasant, and venison. Egg whites or low-cholesterol egg substitutes. Dried beans, peas, lentils, and tofu.Seeds and most nuts. Dairy Low-fat or nonfat cheeses, including ricotta, string, and mozzarella. Skim or 1% milk that is liquid, powdered, or evaporated. Buttermilk that is made with low-fat milk. Nonfat or low-fat yogurt. Beverages Mineral water. Diet carbonated beverages. Sweets and Desserts Sherbets and fruit ices. Honey, jam, marmalade, jelly, and syrups. Meringues and gelatins. Pure sugar candy, such as hard candy, jelly beans, gumdrops, mints, marshmallows, and small amounts of dark chocolate. [employer] Eat all sweets and desserts in moderation. Fats and Oils Nonhydrogenated (trans-free) margarines. Vegetable oils, including soybean, sesame, sunflower, olive, peanut, safflower, corn, canola, and cottonseed. Salad dressings or mayonnaise that are made with a vegetable oil. Limit added fats and oils that you use for cooking, baking, salads, and as spreads. Other Cocoa powder. Coffee and tea. All seasonings and condiments. The items listed above may not be a complete list of recommended foods or beverages. Contact your dietitian for more options. WHAT FOODS ARE NOT RECOMMENDED? Grains Breads that are made with saturated or trans fats, oils, or whole milk. Croissants. Butter rolls. Cheese breads. Sweet rolls. Donuts. Buttered popcorn. Chow mein noodles. High-fat crackers, such as cheese or butter crackers. Meats and Other Protein Sources Fatty meats, such as hotdogs, short ribs, sausage, spareribs, bacon, ribeye roast or steak, and mutton. High-fat deli meats, such as salami and bologna. Caviar. Domestic duck and goose. Organ meats, such as kidney, liver, sweetbreads, brains, gizzard, chitterlings, and heart. Dairy Cream, sour cream, cream  cheese, and creamed cottage cheese. Whole milk cheeses, including blue (bleu), Monterey Jack, [place], [place], American, [place], Swiss, [place], Leaf, and [place]. Whole or 2% milk that is liquid, evaporated, or condensed. Whole buttermilk. Cream sauce or high-fat cheese sauce. Yogurt that is made from whole milk. Beverages Regular sodas and drinks with added sugar. Sweets and Desserts Frosting. Pudding. Cookies. Cakes other than angel food cake. Candy that has milk chocolate or white chocolate, hydrogenated fat, butter, coconut, or unknown ingredients. Buttered syrups. Full-fat ice cream or ice cream drinks. Fats and Oils Gravy that has suet, meat fat, or shortening. Cocoa butter, hydrogenated oils, palm oil, coconut oil, palm kernel oil. These can often be found in baked products, candy, fried foods, nondairy creamers, and whipped toppings. Solid fats and shortenings, including bacon fat, salt pork, lard, and butter. Nondairy cream substitutes, such as coffee creamers and sour cream substitutes. Salad dressings that are made of unknown oils, cheese, or sour cream. The items listed above may not be a complete list of foods and beverages to avoid. Contact your dietitian for more information.   This information is not intended to replace advice given to you by your health care provider. Make sure you discuss any questions  you have with your health care provider.   Document Released: 06/03/2008 Document Revised: 09/15/2014 Document Reviewed: 02/16/2014 Elsevier Interactive Patient Education [employer].

## 2016-04-25 NOTE — Progress Notes (Signed)
  Echocardiogram 2D Echocardiogram has been performed.  Harry Arnold 04/25/2016, 10:23 AM

## 2016-04-25 NOTE — Progress Notes (Addendum)
SUBJECTIVE:  No complaints. CP has resolved after cath.  OBJECTIVE:   Vitals:   Vitals:   04/25/16 0000 04/25/16 0100 04/25/16 0200 04/25/16 0522  BP:  140/86  112/70  Pulse: (!) 128 83  90  Resp: 15  17 17   Temp:    98.7 F (37.1 C)  TempSrc:    Oral  SpO2: 92% 100%  98%  Weight:    231 lb 6.4 oz (105 kg)  Height:       I&O's:   Intake/Output Summary (Last 24 hours) at 04/25/16 [id number] Last data filed at 04/25/16 0500  Gross per 24 hour  Intake             1082 ml  Output             2425 ml  Net            -1343 ml   TELEMETRY: Reviewed telemetry pt in NSR:     PHYSICAL EXAM General: Well developed, well nourished, in no acute distress Head: Eyes PERRLA, No xanthomas.   Normal cephalic and atramatic  Lungs:   Clear bilaterally to auscultation and percussion. Heart:   HRRR S1 S2 Pulses are 2+ & equal. Abdomen: Bowel sounds are positive, abdomen soft and non-tender without masses  Msk:  Back normal, normal gait. Normal strength and tone for age. Extremities:   No clubbing, cyanosis or edema.  DP +1.  Right radial cath site clean with no hematoma Neuro: Alert and oriented X 3. Psych:  Good affect, responds appropriately   LABS: Basic Metabolic Panel:  Recent Labs  04/23/16 1800 04/24/16 0447 04/25/16 0230  NA 137 137 139  K 3.2* 4.2 4.4  CL 104 104 106  CO2 25 27 27   GLUCOSE 121* 123* 104*  BUN 17 17 16   CREATININE 1.18 1.14 1.34*  CALCIUM 8.9 8.5* 8.3*  MG 2.1  --   --    Liver Function Tests:  Recent Labs  04/23/16 1800 04/25/16 0230  AST 30 28  ALT 35 32  ALKPHOS 58 52  BILITOT 0.8 0.5  PROT 6.8 6.0*  ALBUMIN 4.0 3.5   No results for input(s): LIPASE, AMYLASE in the last 72 hours. CBC:  Recent Labs  04/23/16 1800 04/24/16 0447 04/25/16 0230  WBC 7.5 6.0 7.0  NEUTROABS 4.5  --   --   HGB 13.9 12.9* 13.1  HCT 40.1 39.8 41.2  MCV 93.5 98.3 98.6  PLT 202 186 183   Cardiac Enzymes:  Recent Labs  04/24/16 0445 04/24/16 0830  04/25/16 0230  TROPONINI <0.03 0.03* <0.03   BNP: Invalid input(s): POCBNP D-Dimer:  Recent Labs  04/23/16 1800  DDIMER <0.27   Hemoglobin A1C: No results for input(s): HGBA1C in the last 72 hours. Fasting Lipid Panel:  Recent Labs  04/24/16 0447  CHOL 211*  HDL 34*  LDLCALC 156*  TRIG 107  CHOLHDL 6.2   Thyroid Function Tests: No results for input(s): TSH, T4TOTAL, T3FREE, THYROIDAB in the last 72 hours.  Invalid input(s): FREET3 Anemia Panel: No results for input(s): VITAMINB12, FOLATE, FERRITIN, TIBC, IRON, RETICCTPCT in the last 72 hours. Coag Panel:   Lab Results  Component Value Date   INR 0.92 04/23/2016    RADIOLOGY: Dg Chest 2 View  Result Date: 04/23/2016 CLINICAL DATA:  Chest pain and shortness of breath. EXAM: CHEST  2 VIEW COMPARISON:  08/16/2012 FINDINGS: The lungs are clear wiithout focal pneumonia, edema, pneumothorax or pleural effusion. The cardiopericardial silhouette  is within normal limits for size. The visualized bony structures of the thorax are intact. Telemetry leads overlie the chest. IMPRESSION: No active cardiopulmonary disease. Electronically Signed   By: Misty Stanley M.D.   On: 04/23/2016 18:21    ASSESSMENT/PLAN: 1.  Noncardiac chest pain  atypical for angina in that it is sharp and stabbing and has been constant with a trop of only 0.03. Cath revealed normal coronary arteries with normal LVF and normal LVEDP.  Will check 2D echo this am to make sure there is no pericardial effusion but doubt as pain has resolved.  2.  HTN - BP controlled  3.  GERD - continue PPI  4.  Moderate carotid artery stenosis - continue ASA.  LDL 156 - suggest starting statin therapy.  Noncardiac CP.  No further recs.  Continue with current therapy for HTN and carotid stenosis.  Will sign off.  Fransico Him, MD  04/25/2016  9:37 AM

## 2016-04-25 NOTE — Progress Notes (Signed)
Pt noted to have questionable severe sleep apnea throughout the night, this morning, and during naps. Pt has required 2L of 02 at night to keep sats above 92%. MD notified, states pt will follow up outpt.

## 2016-04-25 NOTE — Care Management Note (Signed)
Case Management Note Marvetta Gibbons RN, BSN Unit 2W-Case Manager [phone]  Patient Details  Name: Harry Arnold MRN: [id number] Date of Birth: 1966-08-09  Subjective/Objective:    Pt admitted with chest pain                Action/Plan: PTA pt lived at home with wife- plan to return home- referral received for PCP and medication needs- spoke with pt and wife- pt works and has been seen at the [facility] in [facility] however pt and wife state that the clinic is too costly for them and would like a clinic in [place]- provided them a list of clinics that see self pay pts- also made a call to [facility] but clinic is not making appointments until next Wed. 8/23 for new pt. Have advised pt and his wife to call first thing on 8/23 to try to get an appointment- pt eligible for [facility]- letter provided and program explained with $3 copay cost for each prescription- per discharging MD will not make a referral at this time for sleep study and will let pt establish with a PCP and then f/u for study.   Expected Discharge Date:    04/25/16              Expected Discharge Plan:  Home/Self Care  In-House Referral:     Discharge planning Services  CM Consult, [facility], [facility] Program, Medication Assistance  Post Acute Care Choice:    Choice offered to:     DME Arranged:    DME Agency:     HH Arranged:    HH Agency:     Status of Service:  Completed, signed off  If discussed at [employer], dates discussed:    Additional Comments:  Dawayne Patricia, RN 04/25/2016, 4:12 PM

## 2016-04-25 NOTE — Discharge Summary (Signed)
Physician Discharge Summary  Harry Arnold MRN: [id number] DOB/AGE: 49-Jan-1967 49 y.o.  PCP: No PCP Per Patient   Admit date: 04/23/2016 Discharge date: 04/25/2016  Discharge Diagnoses:    Active Problems:   Pain in the chest   Chest pain   Unstable angina (HCC)    Follow-up recommendations Follow-up with PCP in 3-5 days , including all  additional recommended appointments as below Follow-up CBC, CMP in 3-5 days Recommend outpatient evaluation for sleep apnea      Current Discharge Medication List    START taking these medications   Details  atorvastatin (LIPITOR) 40 MG tablet Take 1 tablet (40 mg total) by mouth daily at 6 PM. Qty: 30 tablet, Refills: 1    pantoprazole (PROTONIX) 40 MG tablet Take 1 tablet (40 mg total) by mouth daily. Qty: 30 tablet, Refills: 0      CONTINUE these medications which have NOT CHANGED   Details  aspirin 81 MG tablet Take 81 mg by mouth daily.    fish oil-omega-3 fatty acids 1000 MG capsule Take 3 g by mouth daily.    HYDROcodone-acetaminophen (NORCO/VICODIN) 5-325 MG per tablet Take 2 tablets by mouth every 4 (four) hours as needed for pain. Qty: 20 tablet, Refills: 0      STOP taking these medications     oxyCODONE-acetaminophen (PERCOCET/ROXICET) 5-325 MG tablet          Discharge Condition: Stable    Discharge Instructions Get Medicines reviewed and adjusted: Please take all your medications with you for your next visit with your Primary MD  Please request your Primary MD to go over all hospital tests and procedure/radiological results at the follow up, please ask your Primary MD to get all Hospital records sent to his/her office.  If you experience worsening of your admission symptoms, develop shortness of breath, life threatening emergency, suicidal or homicidal thoughts you must seek medical attention immediately by calling 911 or calling your MD immediately if symptoms less severe.  You must read complete  instructions/literature along with all the possible adverse reactions/side effects for all the Medicines you take and that have been prescribed to you. Take any new Medicines after you have completely understood and accpet all the possible adverse reactions/side effects.   Do not drive when taking Pain medications.   Do not take more than prescribed Pain, Sleep and Anxiety Medications  Special Instructions: If you have smoked or chewed Tobacco in the last 2 yrs please stop smoking, stop any regular Alcohol and or any Recreational drug use.  Wear Seat belts while driving.  Please note  You were cared for by a hospitalist during your hospital stay. Once you are discharged, your primary care physician will handle any further medical issues. Please note that NO REFILLS for any discharge medications will be authorized once you are discharged, as it is imperative that you return to your primary care physician (or establish a relationship with a primary care physician if you do not have one) for your aftercare needs so that they can reassess your need for medications and monitor your lab values.  Discharge Instructions    Diet - low sodium heart healthy    Complete by:  As directed   Increase activity slowly    Complete by:  As directed       Allergies  Allergen Reactions  . Ibuprofen Swelling    Pt reports a swelling and rash reaction to gel-capsule coated ibuprofen  . Flexeril [Cyclobenzaprine Hcl] Rash  Disposition: 01-Home or Self Care   Consults: Cardiology    Significant Diagnostic Studies:  Dg Chest 2 View  Result Date: 04/23/2016 CLINICAL DATA:  Chest pain and shortness of breath. EXAM: CHEST  2 VIEW COMPARISON:  08/16/2012 FINDINGS: The lungs are clear wiithout focal pneumonia, edema, pneumothorax or pleural effusion. The cardiopericardial silhouette is within normal limits for size. The visualized bony structures of the thorax are intact. Telemetry leads overlie the  chest. IMPRESSION: No active cardiopulmonary disease. Electronically Signed   By: Misty Stanley M.D.   On: 04/23/2016 18:21    2-D echo LV EF: 55% -   60%  ------------------------------------------------------------------- Indications:      Chest pain 786.51.  ------------------------------------------------------------------- History:   PMH:   Chest pain.  Risk factors:  Hypertension.  ------------------------------------------------------------------- Study Conclusions  - Left ventricle: The cavity size was normal. Wall thickness was   normal. Systolic function was normal. The estimated ejection   fraction was in the range of 55% to 60%. Wall motion was normal;   there were no regional wall motion abnormalities. Left   ventricular diastolic function parameters were normal. - Atrial septum: There was an atrial septal aneurysm.   Filed Weights   04/24/16 0014 04/24/16 0428 04/25/16 0522  Weight: 105.2 kg (232 lb) 105.9 kg (233 lb 8 oz) 105 kg (231 lb 6.4 oz)     Microbiology: Recent Results (from the past 240 hour(s))  MRSA PCR Screening     Status: None   Collection Time: 04/24/16 12:17 AM  Result Value Ref Range Status   MRSA by PCR NEGATIVE NEGATIVE Final    Comment:        The GeneXpert MRSA Assay (FDA approved for NASAL specimens only), is one component of a comprehensive MRSA colonization surveillance program. It is not intended to diagnose MRSA infection nor to guide or monitor treatment for MRSA infections.        Blood Culture No results found for: SDES, [place], CULT, REPTSTATUS    Labs: Results for orders placed or performed during the hospital encounter of 04/23/16 (from the past 48 hour(s))  Troponin I     Status: Abnormal   Collection Time: 04/23/16  6:00 PM  Result Value Ref Range   Troponin I 0.03 (HH) <0.03 ng/mL    Comment: CRITICAL RESULT CALLED TO, READ BACK BY AND VERIFIED WITH: J BENTON RN @ [phone] ON 04/23/2016 BY PUNJTAN,G    CBC with Differential     Status: None   Collection Time: 04/23/16  6:00 PM  Result Value Ref Range   WBC 7.5 4.0 - 10.5 K/uL   RBC 4.29 4.22 - 5.81 MIL/uL   Hemoglobin 13.9 13.0 - 17.0 g/dL   HCT 40.1 39.0 - 52.0 %   MCV 93.5 78.0 - 100.0 fL   MCH 32.4 26.0 - 34.0 pg   MCHC 34.7 30.0 - 36.0 g/dL   RDW 13.0 11.5 - 15.5 %   Platelets 202 150 - 400 K/uL   Neutrophils Relative % 59 %   Neutro Abs 4.5 1.7 - 7.7 K/uL   Lymphocytes Relative 26 %   Lymphs Abs 2.0 0.7 - 4.0 K/uL   Monocytes Relative 11 %   Monocytes Absolute 0.8 0.1 - 1.0 K/uL   Eosinophils Relative 3 %   Eosinophils Absolute 0.2 0.0 - 0.7 K/uL   Basophils Relative 1 %   Basophils Absolute 0.0 0.0 - 0.1 K/uL  Comprehensive metabolic panel     Status: Abnormal  Collection Time: 04/23/16  6:00 PM  Result Value Ref Range   Sodium 137 135 - 145 mmol/L   Potassium 3.2 (L) 3.5 - 5.1 mmol/L   Chloride 104 101 - 111 mmol/L   CO2 25 22 - 32 mmol/L   Glucose, Bld 121 (H) 65 - 99 mg/dL   BUN 17 6 - 20 mg/dL   Creatinine, Ser 1.18 0.61 - 1.24 mg/dL   Calcium 8.9 8.9 - 10.3 mg/dL   Total Protein 6.8 6.5 - 8.1 g/dL   Albumin 4.0 3.5 - 5.0 g/dL   AST 30 15 - 41 U/L   ALT 35 17 - 63 U/L   Alkaline Phosphatase 58 38 - 126 U/L   Total Bilirubin 0.8 0.3 - 1.2 mg/dL   GFR calc non Af Amer >60 >60 mL/min   GFR calc Af Amer >60 >60 mL/min    Comment: (NOTE) The eGFR has been calculated using the CKD EPI equation. This calculation has not been validated in all clinical situations. eGFR's persistently <60 mL/min signify possible Chronic Kidney Disease.    Anion gap 8 5 - 15  D-dimer, quantitative     Status: None   Collection Time: 04/23/16  6:00 PM  Result Value Ref Range   D-Dimer, Quant <0.27 0.00 - 0.50 ug/mL-FEU    Comment: (NOTE) At the manufacturer cut-off of 0.50 ug/mL FEU, this assay has been documented to exclude PE with a sensitivity and negative predictive value of 97 to 99%.  At this time, this assay has not  been approved by the FDA to exclude DVT/VTE. Results should be correlated with clinical presentation.   Protime-INR     Status: None   Collection Time: 04/23/16  6:00 PM  Result Value Ref Range   Prothrombin Time 12.4 11.4 - 15.2 seconds   INR 0.92   APTT     Status: None   Collection Time: 04/23/16  6:00 PM  Result Value Ref Range   aPTT 26 24 - 36 seconds  Magnesium     Status: None   Collection Time: 04/23/16  6:00 PM  Result Value Ref Range   Magnesium 2.1 1.7 - 2.4 mg/dL  Urinalysis, Routine w reflex microscopic     Status: None   Collection Time: 04/23/16  6:34 PM  Result Value Ref Range   Color, Urine YELLOW YELLOW   APPearance CLEAR CLEAR   Specific Gravity, Urine 1.009 1.005 - 1.030   pH 6.0 5.0 - 8.0   Glucose, UA NEGATIVE NEGATIVE mg/dL   Hgb urine dipstick NEGATIVE NEGATIVE   Bilirubin Urine NEGATIVE NEGATIVE   Ketones, ur NEGATIVE NEGATIVE mg/dL   Protein, ur NEGATIVE NEGATIVE mg/dL   Nitrite NEGATIVE NEGATIVE   Leukocytes, UA NEGATIVE NEGATIVE    Comment: MICROSCOPIC NOT DONE ON URINES WITH NEGATIVE PROTEIN, BLOOD, LEUKOCYTES, NITRITE, OR GLUCOSE <1000 mg/dL.  Urine rapid drug screen (hosp performed)     Status: None   Collection Time: 04/23/16  6:34 PM  Result Value Ref Range   Opiates NONE DETECTED NONE DETECTED   Cocaine NONE DETECTED NONE DETECTED   Benzodiazepines NONE DETECTED NONE DETECTED   Amphetamines NONE DETECTED NONE DETECTED   Tetrahydrocannabinol NONE DETECTED NONE DETECTED   Barbiturates NONE DETECTED NONE DETECTED    Comment:        DRUG SCREEN FOR MEDICAL PURPOSES ONLY.  IF CONFIRMATION IS NEEDED FOR ANY PURPOSE, NOTIFY LAB WITHIN 5 DAYS.        LOWEST DETECTABLE LIMITS FOR URINE DRUG  SCREEN Drug Class       Cutoff (ng/mL) Amphetamine      1000 Barbiturate      200 Benzodiazepine   400 Tricyclics       867 Opiates          300 Cocaine          300 THC              50   MRSA PCR Screening     Status: None   Collection Time:  04/24/16 12:17 AM  Result Value Ref Range   MRSA by PCR NEGATIVE NEGATIVE    Comment:        The GeneXpert MRSA Assay (FDA approved for NASAL specimens only), is one component of a comprehensive MRSA colonization surveillance program. It is not intended to diagnose MRSA infection nor to guide or monitor treatment for MRSA infections.   Troponin I     Status: None   Collection Time: 04/24/16  4:45 AM  Result Value Ref Range   Troponin I <0.03 <0.03 ng/mL  Basic metabolic panel     Status: Abnormal   Collection Time: 04/24/16  4:47 AM  Result Value Ref Range   Sodium 137 135 - 145 mmol/L   Potassium 4.2 3.5 - 5.1 mmol/L   Chloride 104 101 - 111 mmol/L   CO2 27 22 - 32 mmol/L   Glucose, Bld 123 (H) 65 - 99 mg/dL   BUN 17 6 - 20 mg/dL   Creatinine, Ser 1.14 0.61 - 1.24 mg/dL   Calcium 8.5 (L) 8.9 - 10.3 mg/dL   GFR calc non Af Amer >60 >60 mL/min   GFR calc Af Amer >60 >60 mL/min    Comment: (NOTE) The eGFR has been calculated using the CKD EPI equation. This calculation has not been validated in all clinical situations. eGFR's persistently <60 mL/min signify possible Chronic Kidney Disease.    Anion gap 6 5 - 15  Lipid panel     Status: Abnormal   Collection Time: 04/24/16  4:47 AM  Result Value Ref Range   Cholesterol 211 (H) 0 - 200 mg/dL   Triglycerides 107 <150 mg/dL   HDL 34 (L) >40 mg/dL   Total CHOL/HDL Ratio 6.2 RATIO   VLDL 21 0 - 40 mg/dL   LDL Cholesterol 156 (H) 0 - 99 mg/dL    Comment:        Total Cholesterol/HDL:CHD Risk Coronary Heart Disease Risk Table                     Men   Women  1/2 Average Risk   3.4   3.3  Average Risk       5.0   4.4  2 X Average Risk   9.6   7.1  3 X Average Risk  23.4   11.0        Use the calculated Patient Ratio above and the CHD Risk Table to determine the patient's CHD Risk.        ATP III CLASSIFICATION (LDL):  <100     mg/dL   Optimal  100-129  mg/dL   Near or Above                    Optimal  130-159   mg/dL   Borderline  160-189  mg/dL   High  >190     mg/dL   Very High   CBC     Status:  Abnormal   Collection Time: 04/24/16  4:47 AM  Result Value Ref Range   WBC 6.0 4.0 - 10.5 K/uL   RBC 4.05 (L) 4.22 - 5.81 MIL/uL   Hemoglobin 12.9 (L) 13.0 - 17.0 g/dL   HCT 39.8 39.0 - 52.0 %   MCV 98.3 78.0 - 100.0 fL   MCH 31.9 26.0 - 34.0 pg   MCHC 32.4 30.0 - 36.0 g/dL   RDW 13.3 11.5 - 15.5 %   Platelets 186 150 - 400 K/uL  Heparin level (unfractionated)     Status: None   Collection Time: 04/24/16  7:34 AM  Result Value Ref Range   Heparin Unfractionated 0.36 0.30 - 0.70 IU/mL    Comment:        IF HEPARIN RESULTS ARE BELOW EXPECTED VALUES, AND PATIENT DOSAGE HAS BEEN CONFIRMED, SUGGEST FOLLOW UP TESTING OF ANTITHROMBIN III LEVELS.   Troponin I     Status: Abnormal   Collection Time: 04/24/16  8:30 AM  Result Value Ref Range   Troponin I 0.03 (HH) <0.03 ng/mL    Comment: CRITICAL RESULT CALLED TO, READ BACK BY AND VERIFIED WITH: BROOKS BOBBY RN [phone] PHILLIPS C   Heparin level (unfractionated)     Status: None   Collection Time: 04/24/16  3:54 PM  Result Value Ref Range   Heparin Unfractionated 0.43 0.30 - 0.70 IU/mL    Comment:        IF HEPARIN RESULTS ARE BELOW EXPECTED VALUES, AND PATIENT DOSAGE HAS BEEN CONFIRMED, SUGGEST FOLLOW UP TESTING OF ANTITHROMBIN III LEVELS.   Troponin I     Status: None   Collection Time: 04/25/16  2:30 AM  Result Value Ref Range   Troponin I <0.03 <0.03 ng/mL  CBC     Status: Abnormal   Collection Time: 04/25/16  2:30 AM  Result Value Ref Range   WBC 7.0 4.0 - 10.5 K/uL   RBC 4.18 (L) 4.22 - 5.81 MIL/uL   Hemoglobin 13.1 13.0 - 17.0 g/dL   HCT 41.2 39.0 - 52.0 %   MCV 98.6 78.0 - 100.0 fL   MCH 31.3 26.0 - 34.0 pg   MCHC 31.8 30.0 - 36.0 g/dL   RDW 13.0 11.5 - 15.5 %   Platelets 183 150 - 400 K/uL  Comprehensive metabolic panel     Status: Abnormal   Collection Time: 04/25/16  2:30 AM  Result Value Ref Range   Sodium 139  135 - 145 mmol/L   Potassium 4.4 3.5 - 5.1 mmol/L   Chloride 106 101 - 111 mmol/L   CO2 27 22 - 32 mmol/L   Glucose, Bld 104 (H) 65 - 99 mg/dL   BUN 16 6 - 20 mg/dL   Creatinine, Ser 1.34 (H) 0.61 - 1.24 mg/dL   Calcium 8.3 (L) 8.9 - 10.3 mg/dL   Total Protein 6.0 (L) 6.5 - 8.1 g/dL   Albumin 3.5 3.5 - 5.0 g/dL   AST 28 15 - 41 U/L   ALT 32 17 - 63 U/L   Alkaline Phosphatase 52 38 - 126 U/L   Total Bilirubin 0.5 0.3 - 1.2 mg/dL   GFR calc non Af Amer >60 >60 mL/min   GFR calc Af Amer >60 >60 mL/min    Comment: (NOTE) The eGFR has been calculated using the CKD EPI equation. This calculation has not been validated in all clinical situations. eGFR's persistently <60 mL/min signify possible Chronic Kidney Disease.    Anion gap 6 5 - 15  Lipid Panel     Component Value Date/Time   CHOL 211 (H) 04/24/2016 0447   TRIG 107 04/24/2016 0447   HDL 34 (L) 04/24/2016 0447   CHOLHDL 6.2 04/24/2016 0447   VLDL 21 04/24/2016 0447   LDLCALC 156 (H) 04/24/2016 0447     No results found for: HGBA1C   Lab Results  Component Value Date   LDLCALC 156 (H) 04/24/2016   CREATININE 1.34 (H) 04/25/2016     HPI :*  50yo male with a history of HTN and GERD who had been in his USOH until having sudden onset of chest pain 9/10 at 9am today.  He says that his left arm began to tingle and had numbness of his lower jaw.  He became diaphoretic, nauseated, dizzy and SOB.   It has been constant since this am and nothing makes it better.  He had similar pain a year ago and had a reportedly normal nuclear stress test at [facility].  Per records this was also in setting of possible TIA  with similar symptoms and had a syncopal episode at that time.  For the past month he has had frequent falls with lightheadedness.  He apparently has been diagnosed with moderate carotid artery disease and is on ASA.  He has a history of HTN but cannot afford his meds.  He presented to [facility] where EKG was normal  and initial Trop was slightly elevated at 0.03 x 2.  He continues to complain of chest pain.  Cardiology is now asked to evaluate further.   HOSPITAL COURSE:   1. Noncardiac chest pain  atypical for angina in that it is sharp and stabbing and has been constant with a trop of only 0.03. Cath revealed normal coronary arteries with normal LVF and normal LVEDP.   2-D echo within normal limits, no pericardial effusion, no further cardiac workup indicated, patient may need outpatient evaluation for sleep apnea  2. HTN - BP controlled  3. GERD - continue PPI  4. Moderate carotid artery stenosis - continue ASA.  LDL 156 - started on a statin  Discharge Exam:  Blood pressure 112/70, pulse 90, temperature 98.7 F (37.1 C), temperature source Oral, resp. rate 17, height '5\' 10"'$  (1.778 m), weight 105 kg (231 lb 6.4 oz), SpO2 98 %.   General: Well developed, well nourished, in no acute distress Head: Eyes PERRLA, No xanthomas.   Normal cephalic and atramatic                 Lungs:   Clear bilaterally to auscultation and percussion. Heart:   HRRR S1 S2 Pulses are 2+ & equal. Abdomen: Bowel sounds are positive, abdomen soft and non-tender without masses  Msk:  Back normal, normal gait. Normal strength and tone for age. Extremities:   No clubbing, cyanosis or edema.  DP +1.  Right radial cath site clean with no hematoma Neuro: Alert and oriented X 3. Psych:  Good affect, responds appropriately    Follow-up Information    Primary care provider. Schedule an appointment as soon as possible for a visit in 2 day(s).   Why:  Hospital follow-up          Signed: Reyne Dumas 04/25/2016, 11:51 AM        Time spent >45 mins

## 2016-06-17 ENCOUNTER — Encounter: Payer: Self-pay | Admitting: Family Medicine

## 2016-06-17 ENCOUNTER — Ambulatory Visit (INDEPENDENT_AMBULATORY_CARE_PROVIDER_SITE_OTHER): Payer: Self-pay | Admitting: Family Medicine

## 2016-06-17 VITALS — BP 168/101 | HR 74 | Temp 97.6°F | Resp 16 | Ht 70.0 in | Wt 229.0 lb

## 2016-06-17 DIAGNOSIS — I10 Essential (primary) hypertension: Secondary | ICD-10-CM

## 2016-06-17 DIAGNOSIS — Z0000 Encounter for general adult medical examination without abnormal findings: Secondary | ICD-10-CM

## 2016-06-17 LAB — COMPLETE METABOLIC PANEL WITH GFR
ALT: 29 U/L (ref 9–46)
AST: 21 U/L (ref 10–40)
Albumin: 4.1 g/dL (ref 3.6–5.1)
Alkaline Phosphatase: 59 U/L (ref 40–115)
BUN: 20 mg/dL (ref 7–25)
CO2: 26 mmol/L (ref 20–31)
Calcium: 8.9 mg/dL (ref 8.6–10.3)
Chloride: 105 mmol/L (ref 98–110)
Creat: 1.02 mg/dL (ref 0.60–1.35)
GFR, EST NON AFRICAN AMERICAN: 86 mL/min (ref >=60)
GFR, Est African American: >89 mL/min (ref >=60)
GLUCOSE: 104 mg/dL — AB (ref 65–99)
POTASSIUM: 4 mmol/L (ref 3.5–5.3)
SODIUM: 139 mmol/L (ref 135–146)
Total Bilirubin: 0.5 mg/dL (ref 0.2–1.2)
Total Protein: 6.7 g/dL (ref 6.1–8.1)

## 2016-06-17 LAB — CBC WITH DIFFERENTIAL/PLATELET
BASOS ABS: 47 {cells}/uL (ref 0–200)
Basophils Relative: 1 %
EOS PCT: 5 %
Eosinophils Absolute: 235 cells/uL (ref 15–500)
HCT: 43.3 % (ref 38.5–50.0)
Hemoglobin: 14.6 g/dL (ref 13.2–17.1)
LYMPHS PCT: 32 %
Lymphs Abs: 1504 cells/uL (ref 850–3900)
MCH: 31.9 pg (ref 27.0–33.0)
MCHC: 33.7 g/dL (ref 32.0–36.0)
MCV: 94.5 fL (ref 80.0–100.0)
MONOS PCT: 11 %
MPV: 11.7 fL (ref 7.5–12.5)
Monocytes Absolute: 517 cells/uL (ref 200–950)
NEUTROS ABS: 2397 {cells}/uL (ref 1500–7800)
NEUTROS PCT: 51 %
PLATELETS: 201 10*3/uL (ref 140–400)
RBC: 4.58 MIL/uL (ref 4.20–5.80)
RDW: 13.7 % (ref 11.0–15.0)
WBC: 4.7 10*3/uL (ref 3.8–10.8)

## 2016-06-17 LAB — LIPID PANEL
CHOL/HDL RATIO: 6 ratio — AB (ref <=5.0)
Cholesterol: 210 mg/dL — ABNORMAL HIGH (ref 125–200)
HDL: 35 mg/dL — AB (ref >=40)
LDL CALC: 138 mg/dL — AB (ref <130)
TRIGLYCERIDES: 183 mg/dL — AB (ref <150)
VLDL: 37 mg/dL — ABNORMAL HIGH (ref <30)

## 2016-06-17 LAB — TSH: TSH: 1.87 mIU/L (ref 0.40–4.50)

## 2016-06-17 MED ORDER — PANTOPRAZOLE SODIUM 40 MG PO TBEC: 40.0000 mg | DELAYED_RELEASE_TABLET | Freq: Every day | ORAL | 1 refills | Status: DC

## 2016-06-17 MED ORDER — ATORVASTATIN CALCIUM 40 MG PO TABS: 40.0000 mg | ORAL_TABLET | Freq: Every day | ORAL | 1 refills | Status: DC

## 2016-06-17 MED ORDER — LISINOPRIL 20 MG PO TABS: 20.0000 mg | ORAL_TABLET | Freq: Every day | ORAL | 1 refills | Status: DC

## 2016-06-17 MED FILL — ATORVASTATIN 40 MG TABLET: 40 | 30 days supply | Qty: 30 | Fill #0

## 2016-06-17 MED FILL — LISINOPRIL 20 MG TABLET: 20 | 30 days supply | Qty: 30 | Fill #0

## 2016-06-17 MED FILL — PANTOPRAZOLE SOD DR 40 MG T: 40 | 30 days supply | Qty: 30 | Fill #0

## 2016-06-17 NOTE — Patient Instructions (Signed)
Watch salt in diet. Try to back, broil or grill lean meats, particularly chicken and fish. Try to walk at least 15 minutes 5 days a week. Come back for blood pressure check with nurse in 2 weeks after starting medication I have sent your prescriptions to [facility]. They can help you get at a reduced price.

## 2016-06-17 NOTE — Progress Notes (Signed)
Dravin Schellin, is a 50 y.o. male  [id number]  [id number]  DOB - 1966/07/29  CC:  Chief Complaint  Patient presents with  . Establish Care  . Sleep Apnea    asking for a referral   . Hypertension       HPI: Harry Arnold is a 50 y.o. male here to establish care. He was recently in hospital with chest pain. A cath and echo ruled out cardiac disease. He has a history of hypertension but is not on medications. His original BP today was 175/108, repeat 168/101 He also has a history of GERD, diverticulosis and testicular cancer (2008). He has also had  Knee replacement in distant past. He reports some occ blurry vision and lightheadedness, last time about a month ago. He does reports some reported periods of apnea while in the hospital and it was recommened he have a sleep study.  He reports eating a low salt diet, mostly baking, broiling or grilling meats. He does not exercise regularly. He denies tobacco, alcohol or drug use.   Health maintenance: He accepts a flu shot today. Will also screen for HIV, diabetes and prostate cancer.  Allergies  Allergen Reactions  . Ibuprofen Swelling    Pt reports a swelling and rash reaction to gel-capsule coated ibuprofen  . Flexeril [Cyclobenzaprine Hcl] Rash   Past Medical History:  Diagnosis Date  . Cancer [facility]) 2008   Testicular   . Chest pain 04/2016  . Diverticulitis   . GERD (gastroesophageal reflux disease)   . Hypertension   . Kidney stones    Current Outpatient Prescriptions on File Prior to Visit  Medication Sig Dispense Refill  . aspirin 81 MG tablet Take 81 mg by mouth daily.    . fish oil-omega-3 fatty acids 1000 MG capsule Take 3 g by mouth daily.    Marland Kitchen HYDROcodone-acetaminophen (NORCO/VICODIN) 5-325 MG per tablet Take 2 tablets by mouth every 4 (four) hours as needed for pain. (Patient not taking: Reported on 06/17/2016) 20 tablet 0   No current facility-administered medications on file prior to visit.    History  reviewed. No pertinent family history. Social History   Social History  . Marital status: Married    Spouse name: N/A  . Number of children: N/A  . Years of education: N/A   Occupational History  . Not on file.   Social History Main Topics  . Smoking status: Never Smoker  . Smokeless tobacco: Never Used  . Alcohol use No  . Drug use: No  . Sexual activity: Not on file   Other Topics Concern  . Not on file   Social History Narrative  . No narrative on file    Review of Systems: Constitutional: + for fatigue Skin: Negative HENT: Negative  Eyes: + occ blurry vision Neck: Negative Respiratory: Negative Cardiovascular: Negative Gastrointestinal: Negative Genitourinary: + for nocturia Musculoskeletal: + for low back pain from DDD Neurological: + for occ lightheadedness Hematological: Negative  Psychiatric/Behavioral: Negative    Objective:   Vitals:   06/17/16 0914 06/17/16 1001  BP: (!) 175/108 (!) 168/101  Pulse: 74   Resp: 16   Temp: 97.6 F (36.4 C)     Physical Exam: Constitutional: Patient appears well-developed and well-nourished. No distress. HENT: Normocephalic, atraumatic, External right and left ear normal. Oropharynx is clear and moist.  Eyes: Conjunctivae and EOM are normal. PERRLA, no scleral icterus. Neck: Normal ROM. Neck supple. No lymphadenopathy, No thyromegaly. CVS: RRR, S1/S2 +, no murmurs, no gallops,  no rubs Pulmonary: Effort and breath sounds normal, no stridor, rhonchi, wheezes, rales.  Abdominal: Soft. Normoactive BS,, no distension, tenderness, rebound or guarding.  Musculoskeletal: Normal range of motion. No edema and no tenderness.  Neuro: Alert.Normal muscle tone coordination. Non-focal Skin: Skin is warm and dry. No rash noted. Not diaphoretic. No erythema. No pallor. Psychiatric: Normal mood and affect. Behavior, judgment, thought content normal.  Lab Results  Component Value Date   WBC 7.0 04/25/2016   HGB 13.1 04/25/2016    HCT 41.2 04/25/2016   MCV 98.6 04/25/2016   PLT 183 04/25/2016   Lab Results  Component Value Date   CREATININE 1.34 (H) 04/25/2016   BUN 16 04/25/2016   NA 139 04/25/2016   K 4.4 04/25/2016   CL 106 04/25/2016   CO2 27 04/25/2016    No results found for: HGBA1C Lipid Panel     Component Value Date/Time   CHOL 211 (H) 04/24/2016 0447   TRIG 107 04/24/2016 0447   HDL 34 (L) 04/24/2016 0447   CHOLHDL 6.2 04/24/2016 0447   VLDL 21 04/24/2016 0447   LDLCALC 156 (H) 04/24/2016 0447       Assessment and plan:   1. Healthcare maintenance  - Flu Vaccine QUAD 36+ mos PF IM (Fluarix & Fluzone Quad PF) - CBC with Differential - COMPLETE METABOLIC PANEL WITH GFR - Lipid panel - HIV antibody (with reflex) - PSA - TSH   Return in about 2 weeks (around 07/01/2016) for bp check with nurse. 3 months with me..  The patient was given clear instructions to go to ER or return to medical center if symptoms don't improve, worsen or new problems develop. The patient verbalized understanding.    Micheline Chapman FNP  06/17/2016, 11:02 AM

## 2016-06-18 LAB — HIV ANTIBODY (ROUTINE TESTING W REFLEX): HIV: NONREACTIVE

## 2016-06-18 LAB — PSA: PSA: 1.1 ng/mL (ref <=4.0)

## 2016-07-01 ENCOUNTER — Ambulatory Visit: Payer: Self-pay

## 2016-07-01 DIAGNOSIS — R030 Elevated blood-pressure reading, without diagnosis of hypertension: Secondary | ICD-10-CM

## 2016-07-01 MED ORDER — HYDROCHLOROTHIAZIDE 25 MG PO TABS: 25.0000 mg | ORAL_TABLET | Freq: Every day | ORAL | 0 refills | Status: DC

## 2016-07-01 MED FILL — HYDROCHLOROTHIAZIDE 25 MG T: 25 | 30 days supply | Qty: 30 | Fill #0

## 2016-07-07 ENCOUNTER — Emergency Department (HOSPITAL_BASED_OUTPATIENT_CLINIC_OR_DEPARTMENT_OTHER)
Admission: EM | Admit: 2016-07-07 | Discharge: 2016-07-07 | Disposition: A | Discharge status: Home / Self Care | Payer: Self-pay | Attending: Emergency Medicine | Admitting: Emergency Medicine

## 2016-07-07 ENCOUNTER — Encounter (HOSPITAL_BASED_OUTPATIENT_CLINIC_OR_DEPARTMENT_OTHER): Payer: Self-pay | Admitting: *Deleted

## 2016-07-07 DIAGNOSIS — B349 Viral infection, unspecified: Secondary | ICD-10-CM | POA: Insufficient documentation

## 2016-07-07 DIAGNOSIS — Z79899 Other long term (current) drug therapy: Secondary | ICD-10-CM | POA: Insufficient documentation

## 2016-07-07 DIAGNOSIS — I10 Essential (primary) hypertension: Secondary | ICD-10-CM | POA: Insufficient documentation

## 2016-07-07 DIAGNOSIS — Z8547 Personal history of malignant neoplasm of testis: Secondary | ICD-10-CM | POA: Insufficient documentation

## 2016-07-07 DIAGNOSIS — Z7982 Long term (current) use of aspirin: Secondary | ICD-10-CM | POA: Insufficient documentation

## 2016-07-07 MED ORDER — ACETAMINOPHEN 500 MG PO TABS: 1000.0000 mg | ORAL_TABLET | Freq: Four times a day (QID) | ORAL | 0 refills | Status: DC | PRN

## 2016-07-07 NOTE — ED Notes (Signed)
MD at bedside. 

## 2016-07-07 NOTE — ED Triage Notes (Signed)
Pt reports non productive cough x 4 days, has not used any otc meds, may have had a subjective temp yeserday. Denies any other c/o.

## 2016-07-07 NOTE — ED Provider Notes (Signed)
[place] DEPT [facility] Provider Note   CSN: [id number] Arrival date & time: 07/07/16  1019     History   Chief Complaint Chief Complaint  Patient presents with  . Cough    HPI Harry Arnold is a 50 y.o. male.  HPI Patient has had a cough for several days. The cough has been dry. No documented fever or chest pain in association. The patient reports he has had some nausea. Over the past day now he has developed generalized body aches and is experiencing hot and cold episodes. No vomiting or diarrhea. No headache. Slight scratchy throat. No lower extremity swelling or pain. No rashes. Patient did have a influenza shot this year. He works in [employer] and was sent home today due to symptoms. He has not tried any medications for symptoms. Past Medical History:  Diagnosis Date  . 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 Chest pain 04/2016  . Diverticulitis   . GERD (gastroesophageal reflux disease)   . Hypertension   . Kidney stones     Patient Active Problem List   Diagnosis Date Noted  . Chest pain 04/24/2016  . Unstable angina ([place])   . Pain in the chest 04/23/2016  . Colitis 02/15/2012  . Abdominal pain, acute 02/15/2012    Past Surgical History:  Procedure Laterality Date  . CARDIAC CATHETERIZATION N/A 04/24/2016   Procedure: Left Heart Cath and Coronary Angiography;  Surgeon: Jettie Booze, MD;  Location: [place] [facility];  Service: Cardiovascular;  Laterality: N/A;  . KNEE ARTHROSCOPY    . testicular cancer         Home Medications    Prior to Admission medications   Medication Sig Start Date End Date Taking? Authorizing Provider  acetaminophen (TYLENOL) 500 MG tablet Take 2 tablets (1,000 mg total) by mouth every 6 (six) hours as needed. 07/07/16   Charlesetta Shanks, MD  aspirin 81 MG tablet Take 81 mg by mouth daily.    Historical Provider, MD  atorvastatin (LIPITOR) 40 MG tablet Take 1 tablet (40 mg total) by mouth daily at 6 PM. 06/17/16   Micheline Chapman, NP  fish oil-omega-3 fatty acids 1000 MG capsule Take 3 g by mouth daily.    Historical Provider, MD  hydrochlorothiazide (HYDRODIURIL) 25 MG tablet Take 1 tablet (25 mg total) by mouth daily. 07/01/16   Micheline Chapman, NP  HYDROcodone-acetaminophen (NORCO/VICODIN) 5-325 MG per tablet Take 2 tablets by mouth every 4 (four) hours as needed for pain. Patient not taking: Reported on 06/17/2016 08/16/12   Leonard Schwartz, MD  lisinopril (PRINIVIL,ZESTRIL) 20 MG tablet Take 1 tablet (20 mg total) by mouth daily. 06/17/16   Micheline Chapman, NP  pantoprazole (PROTONIX) 40 MG tablet Take 1 tablet (40 mg total) by mouth daily. 06/17/16   Micheline Chapman, NP    Family History History reviewed. No pertinent family history.  Social History Social History  Substance Use Topics  . Smoking status: Never Smoker  . Smokeless tobacco: Never Used  . Alcohol use No     Allergies   Ibuprofen and Flexeril [cyclobenzaprine hcl]   Review of Systems Review of Systems 10 Systems reviewed and are negative for acute change except as noted in the HPI.   Physical Exam Updated Vital Signs BP 143/95 (BP Location: Left Arm)   Pulse 81   Temp 98.8 F (37.1 C) (Oral)   Resp 18   Ht 5\' 10"  (1.778 m)   Wt 228  lb (103.4 kg)   SpO2 96%   BMI 32.71 kg/m   Physical Exam  Constitutional: He is oriented to person, place, and time. He appears well-developed and well-nourished.  HENT:  Head: Normocephalic and atraumatic.  Nose: Nose normal.  Mouth/Throat: Oropharynx is clear and moist.  Bilateral TMs normal. Moderate cerumen left auditory canal.  Eyes: Conjunctivae and EOM are normal.  Neck: Neck supple.  Cardiovascular: Normal rate and regular rhythm.   No murmur heard. Pulmonary/Chest: Effort normal and breath sounds normal. No respiratory distress.  Abdominal: Soft. There is no tenderness.  Musculoskeletal: He exhibits no edema, tenderness or deformity.  Lymphadenopathy:    He has no  cervical adenopathy.  Neurological: He is alert and oriented to person, place, and time. He exhibits normal muscle tone. Coordination normal.  Skin: Skin is warm and dry. No rash noted.  Psychiatric: He has a normal mood and affect.  Nursing note and vitals reviewed.    ED Treatments / Results  Labs (all labs ordered are listed, but only abnormal results are displayed) Labs Reviewed - No data to display  EKG  EKG Interpretation None       Radiology No results found.  Procedures Procedures (including critical care time)  Medications Ordered in ED Medications - No data to display   Initial Impression / Assessment and Plan / ED Course  I have reviewed the triage vital signs and the nursing notes.  Pertinent labs & imaging results that were available during my care of the patient were reviewed by me and considered in my medical decision making (see chart for details).  Clinical Course    Final Clinical Impressions(s) / ED Diagnoses   Final diagnoses:  Acute viral syndrome   Patient is clinically well in appearance. He has had several days of cough and generalized body aches. There is associated nausea without vomiting and diarrhea. Physical examination is normal. His symptoms are most consistent with viral syndrome. Patient will be treated symptomatically with Tylenol for body aches and chills. He is counseled on signs and symptoms for which to return. New Prescriptions New Prescriptions   ACETAMINOPHEN (TYLENOL) 500 MG TABLET    Take 2 tablets (1,000 mg total) by mouth every 6 (six) hours as needed.     Charlesetta Shanks, MD 07/07/16 [phone]

## 2016-07-07 NOTE — ED Notes (Signed)
Worknote given. Pt directed to pharmacy to pick up medication

## 2016-07-17 DIAGNOSIS — R030 Elevated blood-pressure reading, without diagnosis of hypertension: Secondary | ICD-10-CM | POA: Insufficient documentation

## 2016-08-04 ENCOUNTER — Ambulatory Visit: Payer: Self-pay

## 2016-09-22 ENCOUNTER — Ambulatory Visit: Payer: Self-pay | Admitting: Family Medicine

## 2016-10-20 ENCOUNTER — Ambulatory Visit: Payer: Self-pay | Admitting: Family Medicine

## 2016-10-20 MED FILL — ATORVASTATIN 40 MG TABLET: 40 | 30 days supply | Qty: 30 | Fill #1

## 2016-10-20 MED FILL — ?PANTOPRAZOLE SOD DR 40MG: 40 MG | 30 days supply | Qty: 30 | Fill #1

## 2016-10-20 MED FILL — ?LISINOPRIL 20 MG TABLET: 20 | 30 days supply | Qty: 30 | Fill #1

## 2016-10-27 ENCOUNTER — Ambulatory Visit (INDEPENDENT_AMBULATORY_CARE_PROVIDER_SITE_OTHER): Payer: Self-pay | Admitting: Family Medicine

## 2016-10-27 ENCOUNTER — Encounter: Payer: Self-pay | Admitting: Family Medicine

## 2016-10-27 VITALS — BP 146/90 | HR 83 | Temp 98.1°F | Resp 12 | Ht 70.0 in | Wt 233.0 lb

## 2016-10-27 DIAGNOSIS — Z23 Encounter for immunization: Secondary | ICD-10-CM

## 2016-10-27 DIAGNOSIS — I10 Essential (primary) hypertension: Secondary | ICD-10-CM

## 2016-10-27 DIAGNOSIS — E785 Hyperlipidemia, unspecified: Secondary | ICD-10-CM

## 2016-10-27 MED ORDER — AMLODIPINE BESYLATE 5 MG PO TABS: 5.0000 mg | ORAL_TABLET | Freq: Every day | ORAL | 3 refills | Status: DC

## 2016-10-27 MED FILL — AMLODIPINE BESYLATE 5 MG TA: 5 | 30 days supply | Qty: 30 | Fill #0

## 2016-10-27 NOTE — Progress Notes (Signed)
Harry Arnold, is a 51 y.o. male  [id number]  [id number]  DOB - 12-14-1965  CC:  Chief Complaint  Patient presents with  . Headache    starts in back of neck and radiates into the back of his head x 2-3 weeks at night  . Hypertension    taking meds as directed  . sleep apnea    seems this condition is worse       HPI: Harry Arnold is a 51 y.o. male here for follow-up hypertension and hypercholesterolemia. He reports taking his medictions regularly, avoiding salt, fats and cholesterol and doing a lot of walking at work. He does complain of of occasional headaches. He reports his wife tells him he stops breathing for short periods at night. He reports not sleeping well and daytime fatigue. He is on lisinopril 20, hctz 25 and Lipitor 40. His original BP today was 154/93, repeat 146/90. He denies chest pain or shortness of breath. Medication is keeping his GERD under good control.  Health maintenance:  He has had a flu shot for this season but needs a Tdap which he will receive today.    Allergies  Allergen Reactions  . Ibuprofen Swelling    Pt reports a swelling and rash reaction to gel-capsule coated ibuprofen  . Flexeril [Cyclobenzaprine Hcl] Rash   Past Medical History:  Diagnosis Date  . 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 Chest pain 04/2016  . Diverticulitis   . GERD (gastroesophageal reflux disease)   . Hypertension   . Kidney stones    Current Outpatient Prescriptions on File Prior to Visit  Medication Sig Dispense Refill  . aspirin 81 MG tablet Take 81 mg by mouth daily.    Marland Kitchen atorvastatin (LIPITOR) 40 MG tablet Take 1 tablet (40 mg total) by mouth daily at 6 PM. 90 tablet 1  . hydrochlorothiazide (HYDRODIURIL) 25 MG tablet Take 1 tablet (25 mg total) by mouth daily. 90 tablet 0  . lisinopril (PRINIVIL,ZESTRIL) 20 MG tablet Take 1 tablet (20 mg total) by mouth daily. 90 tablet 1  . pantoprazole (PROTONIX) 40 MG tablet Take 1 tablet (40 mg total) by mouth  daily. 99 tablet 1  . acetaminophen (TYLENOL) 500 MG tablet Take 2 tablets (1,000 mg total) by mouth every 6 (six) hours as needed. (Patient not taking: Reported on 10/27/2016) 30 tablet 0  . fish oil-omega-3 fatty acids 1000 MG capsule Take 3 g by mouth daily.    Marland Kitchen HYDROcodone-acetaminophen (NORCO/VICODIN) 5-325 MG per tablet Take 2 tablets by mouth every 4 (four) hours as needed for pain. (Patient not taking: Reported on 06/17/2016) 20 tablet 0   No current facility-administered medications on file prior to visit.    History reviewed. No pertinent family history. Social History   Social History  . Marital status: Married    Spouse name: N/A  . Number of children: N/A  . Years of education: N/A   Occupational History  . Not on file.   Social History Main Topics  . Smoking status: Never Smoker  . Smokeless tobacco: Never Used  . Alcohol use No  . Drug use: No  . Sexual activity: Not on file   Other Topics Concern  . Not on file   Social History Narrative  . No narrative on file    Review of Systems: Constitutional: Negative Skin: Negative HENT: Negative  Eyes: Negative  Neck: Negative Respiratory: Negative Cardiovascular: Negative Gastrointestinal: Negative Genitourinary: Negative  Musculoskeletal: + for back and  knee pain Neurological: Negative for Hematological: Negative  Psychiatric/Behavioral: + insomnia.    Objective:   Vitals:   10/27/16 1029 10/27/16 1057  BP: (!) 154/93 (!) 146/90  Pulse: 83   Resp: 12   Temp: 98.1 F (36.7 C)     Physical Exam: Constitutional: Patient appears well-developed and well-nourished. No distress. HENT: Normocephalic, atraumatic, External right and left ear normal. Oropharynx is clear and moist.  Eyes: Conjunctivae and EOM are normal. PERRLA, no scleral icterus. Neck: Normal ROM. Neck supple. No lymphadenopathy, No thyromegaly. CVS: RRR, S1/S2 +, no murmurs, no gallops, no rubs Pulmonary: Effort and breath sounds  normal, no stridor, rhonchi, wheezes, rales.  Abdominal: Soft. Normoactive BS,, no distension, tenderness, rebound or guarding.  Musculoskeletal: Normal range of motion. No edema and no tenderness.  Neuro: Alert.Normal muscle tone coordination. Non-focal Skin: Skin is warm and dry. No rash noted. Not diaphoretic. No erythema. No pallor. Psychiatric: Normal mood and affect. Behavior, judgment, thought content normal.  Lab Results  Component Value Date   WBC 4.7 06/17/2016   HGB 14.6 06/17/2016   HCT 43.3 06/17/2016   MCV 94.5 06/17/2016   PLT 201 06/17/2016   Lab Results  Component Value Date   CREATININE 1.02 06/17/2016   BUN 20 06/17/2016   NA 139 06/17/2016   K 4.0 06/17/2016   CL 105 06/17/2016   CO2 26 06/17/2016    No results found for: HGBA1C Lipid Panel     Component Value Date/Time   CHOL 210 (H) 06/17/2016 0948   TRIG 183 (H) 06/17/2016 0948   HDL 35 (L) 06/17/2016 0948   CHOLHDL 6.0 (H) 06/17/2016 0948   VLDL 37 (H) 06/17/2016 0948   LDLCALC 138 (H) 06/17/2016 0948        Assessment and plan:   1. Essential hypertension  - amLODipine (NORVASC) 5 MG tablet; Take 1 tablet (5 mg total) by mouth daily.  Dispense: 90 tablet; Refill: 3 -Continue lisinopril and hctz 2. Hyperlipidemia, unspecified hyperlipidemia type Continue Lipitor  3. Need for Tdap vaccination  - Tdap vaccine greater than or equal to 7yo IM   Return in about 6 months (around 04/26/2017).  The patient was given clear instructions to go to ER or return to medical center if symptoms don't improve, worsen or new problems develop. The patient verbalized understanding.    Micheline Chapman FNP  10/27/2016, 11:50 AM

## 2016-10-27 NOTE — Patient Instructions (Signed)
Am adding an additional medication for blood pressure. Continue to watch salt and cholesterol

## 2016-11-15 ENCOUNTER — Emergency Department (HOSPITAL_BASED_OUTPATIENT_CLINIC_OR_DEPARTMENT_OTHER)
Admission: EM | Admit: 2016-11-15 | Discharge: 2016-11-15 | Disposition: A | Discharge status: Home / Self Care | Payer: Worker's Compensation | Attending: Emergency Medicine | Admitting: Emergency Medicine

## 2016-11-15 ENCOUNTER — Encounter (HOSPITAL_BASED_OUTPATIENT_CLINIC_OR_DEPARTMENT_OTHER): Payer: Self-pay | Admitting: Emergency Medicine

## 2016-11-15 DIAGNOSIS — Y929 Unspecified place or not applicable: Secondary | ICD-10-CM | POA: Diagnosis not present

## 2016-11-15 DIAGNOSIS — Z79899 Other long term (current) drug therapy: Secondary | ICD-10-CM | POA: Insufficient documentation

## 2016-11-15 DIAGNOSIS — Y939 Activity, unspecified: Secondary | ICD-10-CM | POA: Insufficient documentation

## 2016-11-15 DIAGNOSIS — Z7982 Long term (current) use of aspirin: Secondary | ICD-10-CM | POA: Diagnosis not present

## 2016-11-15 DIAGNOSIS — W1809XA Striking against other object with subsequent fall, initial encounter: Secondary | ICD-10-CM | POA: Diagnosis not present

## 2016-11-15 DIAGNOSIS — S0990XA Unspecified injury of head, initial encounter: Secondary | ICD-10-CM

## 2016-11-15 DIAGNOSIS — Z8547 Personal history of malignant neoplasm of testis: Secondary | ICD-10-CM | POA: Diagnosis not present

## 2016-11-15 DIAGNOSIS — S0003XA Contusion of scalp, initial encounter: Secondary | ICD-10-CM | POA: Diagnosis not present

## 2016-11-15 DIAGNOSIS — I10 Essential (primary) hypertension: Secondary | ICD-10-CM | POA: Diagnosis not present

## 2016-11-15 DIAGNOSIS — Y990 Civilian activity done for income or pay: Secondary | ICD-10-CM | POA: Diagnosis not present

## 2016-11-15 DIAGNOSIS — W19XXXA Unspecified fall, initial encounter: Secondary | ICD-10-CM

## 2016-11-15 NOTE — ED Provider Notes (Signed)
[place] DEPT [facility] Provider Note   CSN: [id number] Arrival date & time: 11/15/16  1807  By signing my name below, I, Gwenlyn Fudge, attest that this documentation has been prepared under the direction and in the presence of [employer], PA-C. Electronically Signed: Gwenlyn Fudge, ED Scribe. 11/15/16. 7:43 PM.  History   Chief Complaint Chief Complaint  Patient presents with  . Head Injury   The history is provided by the patient. No language interpreter was used.   HPI Comments: Harry Arnold is a 51 y.o. male with PMHx of HTN who presents to the Emergency Department complaining of acute onset hematoma to the right side of his scalp s/p fall around 12:00 PM today. Pt was in the walk in cooler at work when he spun around and struck the right side of his head on the wall. He reports associated nausea and persistent headache. He takes a baby Aspirin daily, but does not take any other blood thinners. Denies LOC, numbness, tingling, weakness, vomiting, confusion.  Past Medical History:  Diagnosis Date  . 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 Chest pain 04/2016  . Diverticulitis   . GERD (gastroesophageal reflux disease)   . Hypertension   . Kidney stones     Patient Active Problem List   Diagnosis Date Noted  . Elevated blood pressure reading 07/17/2016  . Chest pain 04/24/2016  . Unstable angina ([place])   . Pain in the chest 04/23/2016  . Colitis 02/15/2012  . Abdominal pain, acute 02/15/2012    Past Surgical History:  Procedure Laterality Date  . CARDIAC CATHETERIZATION N/A 04/24/2016   Procedure: Left Heart Cath and Coronary Angiography;  Surgeon: Jettie Booze, MD;  Location: [place] CV LAB;  Service: Cardiovascular;  Laterality: N/A;  . KNEE ARTHROSCOPY    . testicular cancer      Home Medications    Prior to Admission medications   Medication Sig Start Date End Date Taking? Authorizing Provider  amLODipine (NORVASC) 5 MG tablet Take 1 tablet (5 mg  total) by mouth daily. 10/27/16  Yes Micheline Chapman, NP  aspirin 81 MG tablet Take 81 mg by mouth daily.   Yes Historical Provider, MD  atorvastatin (LIPITOR) 40 MG tablet Take 1 tablet (40 mg total) by mouth daily at 6 PM. 06/17/16  Yes Micheline Chapman, NP  fish oil-omega-3 fatty acids 1000 MG capsule Take 3 g by mouth daily.   Yes Historical Provider, MD  hydrochlorothiazide (HYDRODIURIL) 25 MG tablet Take 1 tablet (25 mg total) by mouth daily. 07/01/16  Yes Micheline Chapman, NP  lisinopril (PRINIVIL,ZESTRIL) 20 MG tablet Take 1 tablet (20 mg total) by mouth daily. 06/17/16  Yes Micheline Chapman, NP  pantoprazole (PROTONIX) 40 MG tablet Take 1 tablet (40 mg total) by mouth daily. 06/17/16  Yes Micheline Chapman, NP  acetaminophen (TYLENOL) 500 MG tablet Take 2 tablets (1,000 mg total) by mouth every 6 (six) hours as needed. Patient not taking: Reported on 10/27/2016 07/07/16   Charlesetta Shanks, MD  HYDROcodone-acetaminophen (NORCO/VICODIN) 5-325 MG per tablet Take 2 tablets by mouth every 4 (four) hours as needed for pain. Patient not taking: Reported on 06/17/2016 08/16/12   Leonard Schwartz, MD    Family History No family history on file.  Social History Social History  Substance Use Topics  . Smoking status: Never Smoker  . Smokeless tobacco: Never Used  . Alcohol use No     Allergies   Ibuprofen and Flexeril [  cyclobenzaprine hcl]   Review of Systems Review of Systems  HENT:       +Hematoma to scalp  Neurological: Positive for headaches. Negative for syncope, weakness and numbness.  Psychiatric/Behavioral: Negative for confusion.  All other systems reviewed and are negative.  Physical Exam Updated Vital Signs BP 155/96 (BP Location: Right Arm)   Pulse 75   Temp 98.6 F (37 C) (Oral)   Resp 19   Ht 5\' 7"  (1.702 m)   Wt 105.7 kg   SpO2 99%   BMI 36.49 kg/m   Physical Exam  Constitutional: He is oriented to person, place, and time. He appears well-developed and  well-nourished. He is active. No distress.  HENT:  Head: Normocephalic.  Minor hematoma to right temporal region; no surrounding skull abnormality   Eyes: Conjunctivae are normal.  Cardiovascular: Normal rate.   Pulmonary/Chest: Effort normal. No respiratory distress.  Musculoskeletal: Normal range of motion.  Neurological: He is alert and oriented to person, place, and time. He has normal strength. No cranial nerve deficit or sensory deficit. GCS eye subscore is 4. GCS verbal subscore is 5. GCS motor subscore is 6.  Skin: Skin is warm and dry.  Psychiatric: He has a normal mood and affect. His behavior is normal.  Nursing note and vitals reviewed.  ED Treatments / Results  DIAGNOSTIC STUDIES: Oxygen Saturation is 99% on RA, normal by my interpretation.    Labs (all labs ordered are listed, but only abnormal results are displayed) Labs Reviewed - No data to display  EKG  EKG Interpretation None       Radiology No results found.  Procedures Procedures (including critical care time)  Medications Ordered in ED Medications - No data to display   Initial Impression / Assessment and Plan / ED Course  I have reviewed the triage vital signs and the nursing notes.  Pertinent labs & imaging results that were available during my care of the patient were reviewed by me and considered in my medical decision making (see chart for details).     I personally performed the services described in this documentation, which was scribed in my presence. The recorded information has been reviewed and is accurate.   Final Clinical Impressions(s) / ED Diagnoses   Final diagnoses:  Injury of head, initial encounter  Fall, initial encounter    51 year old male presents today with head injury.  Patient struck his head on a wall, no loss of consciousness, no neurological deficits, no concerning signs or symptoms for intracranial abnormality.  Based on the [place] head CT criteria he meets  no criteria, clinically has very minor signs of trauma.  Patient has a normal neuro exam.  Discussed CT versus non-CT patient agrees that no need for further evaluation at this time.  Patient will be discharged home with primary care follow-up, strict return precautions.  He verbalized understanding and agreement to today's plan had no further questions or concerns at time of discharge.  New Prescriptions New Prescriptions   No medications on file     Okey Regal, PA-C 11/15/16 2028    Malvin Johns, MD 11/15/16 2109

## 2016-11-15 NOTE — ED Triage Notes (Signed)
Hit his right side of his head on the walk in cooler at work today. Denies LOC but has h/a. Slight hematoma noted to right side of head

## 2016-11-15 NOTE — Discharge Instructions (Signed)
Please read attached information. If you experience any new or worsening signs or symptoms please return to the emergency room for evaluation. Please follow-up with your primary care provider or specialist as discussed.  °

## 2016-11-24 MED FILL — ?PANTOPRAZOLE SOD DR 40MG: 40 MG | 30 days supply | Qty: 30 | Fill #2

## 2016-11-24 MED FILL — ?ATORVASTATIN 40MG TABLET: 40 | 30 days supply | Qty: 30 | Fill #2

## 2016-11-24 MED FILL — ?AMLODIPINE BESYLATE 5 MG T: 5 | 30 days supply | Qty: 30 | Fill #1

## 2016-11-24 MED FILL — ?LISINOPRIL 20 MG TABLET: 20 | 30 days supply | Qty: 30 | Fill #2

## 2016-11-27 ENCOUNTER — Emergency Department (HOSPITAL_BASED_OUTPATIENT_CLINIC_OR_DEPARTMENT_OTHER)
Admission: EM | Admit: 2016-11-27 | Discharge: 2016-11-27 | Disposition: A | Discharge status: Home / Self Care | Payer: Self-pay | Attending: Emergency Medicine | Admitting: Emergency Medicine

## 2016-11-27 ENCOUNTER — Encounter (HOSPITAL_BASED_OUTPATIENT_CLINIC_OR_DEPARTMENT_OTHER): Payer: Self-pay

## 2016-11-27 DIAGNOSIS — N200 Calculus of kidney: Secondary | ICD-10-CM | POA: Insufficient documentation

## 2016-11-27 DIAGNOSIS — Z8547 Personal history of malignant neoplasm of testis: Secondary | ICD-10-CM | POA: Insufficient documentation

## 2016-11-27 DIAGNOSIS — I10 Essential (primary) hypertension: Secondary | ICD-10-CM | POA: Insufficient documentation

## 2016-11-27 DIAGNOSIS — Z79899 Other long term (current) drug therapy: Secondary | ICD-10-CM | POA: Insufficient documentation

## 2016-11-27 MED ORDER — ONDANSETRON 4 MG PO TBDP
4.0000 mg | ORAL_TABLET | Freq: Once | ORAL | Status: AC
  Administered 2016-11-27: 4 mg via ORAL
  Filled 2016-11-27: qty 1

## 2016-11-27 MED ORDER — MORPHINE SULFATE (PF) 4 MG/ML IV SOLN: 4.0000 mg | Freq: Once | INTRAVENOUS | Status: DC

## 2016-11-27 NOTE — ED Triage Notes (Signed)
PT reports over one week history of left flank and left lower back pain, reports diagnosed with renal stone at [facility].

## 2016-11-27 NOTE — Discharge Instructions (Signed)
You have an appointment with Dr.Eskew today at 1 PM. It is very important that you keep this scheduled appointment. [facility] [address]  [place] , [place] [address] Phone: [phone] Fax: [phone]  Return to the ER for new or worsening symptoms, any additional concerns.

## 2016-11-27 NOTE — ED Provider Notes (Signed)
[place] DEPT M[facility] Provider Note   CSN: [id number] Arrival date & time: 11/27/16  0940     History   Chief Complaint Chief Complaint  Patient presents with  . Abdominal Pain    HPI Harry Arnold is a 51 y.o. male.  The history is provided by the patient and medical records. No language interpreter was used.   Harry Arnold is a 51 y.o. male  with a PMH of kidney stones who presents to the Emergency Department complaining of persistent left lower back pain since 3/15 (one week ago) which intermittently radiates to the groin. He endorses having to strain to urinate as well. Patient was seen at [facility] at that time where he was diagnosed with a 5 mm obstructive stone lodged within the mid left ureter. His pain was controlled and he was sent home with symptomatic home care and rx for zofran and pain meds. He then returned to [facility] On 3/19 for continued pain. Another CT scan was performed and he was told that he did not pass stone yet. It appears that urology was consulted and he was supposed follow-up with Dr. Nevada Crane in the clinic, however patient has not done so, stating he called a clinic and could not get an appointment for a month. He presents to ED today because pain has not improved. He is having no fever or chills. No vomiting, however has been nauseous. He was given Zofran, but has run out now. He was also prescribed Percocet which he is out of as well.    Past Medical History:  Diagnosis Date  . 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 Chest pain 04/2016  . Diverticulitis   . GERD (gastroesophageal reflux disease)   . Hypertension   . Kidney stones     Patient Active Problem List   Diagnosis Date Noted  . Elevated blood pressure reading 07/17/2016  . Chest pain 04/24/2016  . Unstable angina ([place])   . Pain in the chest 04/23/2016  . Colitis 02/15/2012  . Abdominal pain, acute 02/15/2012    Past Surgical History:  Procedure Laterality Date  . CARDIAC  CATHETERIZATION N/A 04/24/2016   Procedure: Left Heart Cath and Coronary Angiography;  Surgeon: Jettie Booze, MD;  Location: [place] [facility];  Service: Cardiovascular;  Laterality: N/A;  . KNEE ARTHROSCOPY    . testicular cancer         Home Medications    Prior to Admission medications   Medication Sig Start Date End Date Taking? Authorizing Provider  acetaminophen (TYLENOL) 500 MG tablet Take 2 tablets (1,000 mg total) by mouth every 6 (six) hours as needed. 07/07/16  Yes Charlesetta Shanks, MD  amLODipine (NORVASC) 5 MG tablet Take 1 tablet (5 mg total) by mouth daily. 10/27/16  Yes Micheline Chapman, NP  aspirin 81 MG tablet Take 81 mg by mouth daily.   Yes Historical Provider, MD  atorvastatin (LIPITOR) 40 MG tablet Take 1 tablet (40 mg total) by mouth daily at 6 PM. 06/17/16  Yes Micheline Chapman, NP  fish oil-omega-3 fatty acids 1000 MG capsule Take 3 g by mouth daily.   Yes Historical Provider, MD  hydrochlorothiazide (HYDRODIURIL) 25 MG tablet Take 1 tablet (25 mg total) by mouth daily. 07/01/16  Yes Micheline Chapman, NP  lisinopril (PRINIVIL,ZESTRIL) 20 MG tablet Take 1 tablet (20 mg total) by mouth daily. 06/17/16  Yes Micheline Chapman, NP  pantoprazole (PROTONIX) 40 MG tablet Take 1 tablet (40  mg total) by mouth daily. 06/17/16  Yes Micheline Chapman, NP  HYDROcodone-acetaminophen (NORCO/VICODIN) 5-325 MG per tablet Take 2 tablets by mouth every 4 (four) hours as needed for pain. Patient not taking: Reported on 06/17/2016 08/16/12   Leonard Schwartz, MD    Family History History reviewed. No pertinent family history.  Social History Social History  Substance Use Topics  . Smoking status: Never Smoker  . Smokeless tobacco: Never Used  . Alcohol use No     Allergies   Ibuprofen and Flexeril [cyclobenzaprine hcl]   Review of Systems Review of Systems  Constitutional: Negative for chills and fever.  HENT: Negative for congestion.   Eyes: Negative for visual  disturbance.  Respiratory: Negative for cough and shortness of breath.   Cardiovascular: Negative for chest pain.  Gastrointestinal: Positive for abdominal pain and nausea. Negative for blood in stool, constipation, diarrhea and vomiting.  Genitourinary: Positive for difficulty urinating and flank pain.  Musculoskeletal: Negative for back pain and neck pain.  Skin: Negative for rash.  Neurological: Negative for headaches.     Physical Exam Updated Vital Signs BP (!) 140/94   Pulse (!) 101   Temp 98.5 F (36.9 C) (Oral)   Resp 18   Ht 5\' 10"  (1.778 m)   Wt 105.7 kg   SpO2 96%   BMI 33.43 kg/m   Physical Exam  Constitutional: He is oriented to person, place, and time. He appears well-developed and well-nourished. No distress.  Well appearing male resting comfortably in the bed.  HENT:  Head: Normocephalic and atraumatic.  Cardiovascular: Normal rate, regular rhythm and normal heart sounds.   No murmur heard. Pulmonary/Chest: Effort normal and breath sounds normal. No respiratory distress.  Abdominal: Soft. Bowel sounds are normal. He exhibits no distension.  No CVA tenderness. Tenderness to palpation along the left flank and lower quadrant.   Musculoskeletal: Normal range of motion.  Neurological: He is alert and oriented to person, place, and time.  Skin: Skin is warm and dry.  Nursing note and vitals reviewed.    ED Treatments / Results  Labs (all labs ordered are listed, but only abnormal results are displayed) Labs Reviewed - No data to display  EKG  EKG Interpretation None       Radiology No results found.  Procedures Procedures (including critical care time)  Medications Ordered in ED Medications  ondansetron (ZOFRAN-ODT) disintegrating tablet 4 mg (not administered)  morphine 4 MG/ML injection 4 mg (not administered)     Initial Impression / Assessment and Plan / ED Course  I have reviewed the triage vital signs and the nursing  notes.  Pertinent labs & imaging results that were available during my care of the patient were reviewed by me and considered in my medical decision making (see chart for details).    Harry Arnold is a 51 y.o. male who presents to ED for persistent left flank pain 2/2 known kidney stone diagnosed by CT at [facility] on 3/15. He was again seen on 3/19 where another CT was performed confirming he still had not passed stone. Chart reviewed at length from these encounters. It appears EDP at [facility] did consult urology, Dr. Nevada Crane, and had intentions for him to follow-up in the clinic, however patient has not seen a urologist. He is afebrile and non-toxic appearing. No sxs to suggest infection. At this point, what patient really needs is to see a urologist for stone. Given Dr. Nevada Crane with [facility] regional physicians was consulted for case already,  I have spoken with his office. Unfortunately Dr. Nevada Crane himself could not see patient this week, however his colleague, Dr. Estill Dooms has an open appointment today at 1pm. Appointment scheduled and patient aware that he must go to this appointment. All questions answered.   Patient discussed with Dr. Maryan Rued who agrees with treatment plan.    Final Clinical Impressions(s) / ED Diagnoses   Final diagnoses:  Kidney stone    New Prescriptions New Prescriptions   No medications on file     [facility], PA-C 11/27/16 [place], MD 11/28/16 2029

## 2016-12-01 ENCOUNTER — Encounter (HOSPITAL_BASED_OUTPATIENT_CLINIC_OR_DEPARTMENT_OTHER): Payer: Self-pay

## 2016-12-01 ENCOUNTER — Emergency Department (HOSPITAL_BASED_OUTPATIENT_CLINIC_OR_DEPARTMENT_OTHER)
Admission: EM | Admit: 2016-12-01 | Discharge: 2016-12-01 | Disposition: A | Discharge status: Home / Self Care | Payer: Self-pay | Attending: Emergency Medicine | Admitting: Emergency Medicine

## 2016-12-01 ENCOUNTER — Emergency Department (HOSPITAL_BASED_OUTPATIENT_CLINIC_OR_DEPARTMENT_OTHER): Payer: Self-pay

## 2016-12-01 DIAGNOSIS — I10 Essential (primary) hypertension: Secondary | ICD-10-CM | POA: Insufficient documentation

## 2016-12-01 DIAGNOSIS — N4889 Other specified disorders of penis: Secondary | ICD-10-CM | POA: Insufficient documentation

## 2016-12-01 DIAGNOSIS — Z7982 Long term (current) use of aspirin: Secondary | ICD-10-CM | POA: Insufficient documentation

## 2016-12-01 DIAGNOSIS — R110 Nausea: Secondary | ICD-10-CM | POA: Insufficient documentation

## 2016-12-01 DIAGNOSIS — R319 Hematuria, unspecified: Secondary | ICD-10-CM | POA: Insufficient documentation

## 2016-12-01 LAB — BASIC METABOLIC PANEL
Anion gap: 8 (ref 5–15)
BUN: 23 mg/dL — ABNORMAL HIGH (ref 6–20)
CALCIUM: 8.4 mg/dL — AB (ref 8.9–10.3)
CO2: 24 mmol/L (ref 22–32)
CREATININE: 1.26 mg/dL — AB (ref 0.61–1.24)
Chloride: 107 mmol/L (ref 101–111)
GFR calc Af Amer: >60 mL/min (ref >60)
GFR calc non Af Amer: >60 mL/min (ref >60)
GLUCOSE: 122 mg/dL — AB (ref 65–99)
Potassium: 3.5 mmol/L (ref 3.5–5.1)
Sodium: 139 mmol/L (ref 135–145)

## 2016-12-01 LAB — URINALYSIS, ROUTINE W REFLEX MICROSCOPIC

## 2016-12-01 LAB — CBC WITH DIFFERENTIAL/PLATELET
BASOS PCT: 1 %
Basophils Absolute: 0.1 10*3/uL (ref 0.0–0.1)
EOS ABS: 0.4 10*3/uL (ref 0.0–0.7)
Eosinophils Relative: 3 %
HCT: 37.9 % — ABNORMAL LOW (ref 39.0–52.0)
Hemoglobin: 13 g/dL (ref 13.0–17.0)
Lymphocytes Relative: 24 %
Lymphs Abs: 2.6 10*3/uL (ref 0.7–4.0)
MCH: 32.5 pg (ref 26.0–34.0)
MCHC: 34.3 g/dL (ref 30.0–36.0)
MCV: 94.8 fL (ref 78.0–100.0)
MONO ABS: 1 10*3/uL (ref 0.1–1.0)
MONOS PCT: 10 %
Neutro Abs: 6.8 10*3/uL (ref 1.7–7.7)
Neutrophils Relative %: 62 %
Platelets: 196 10*3/uL (ref 150–400)
RBC: 4 MIL/uL — ABNORMAL LOW (ref 4.22–5.81)
RDW: 12.7 % (ref 11.5–15.5)
WBC: 10.9 10*3/uL — ABNORMAL HIGH (ref 4.0–10.5)

## 2016-12-01 LAB — URINALYSIS, MICROSCOPIC (REFLEX)

## 2016-12-01 NOTE — ED Triage Notes (Signed)
Pt c/o bilateral flank pain since this afternoon with hematuria.  He had lithotripsy last week with stent placement.  Pt has not had any of his pain meds since last night.

## 2016-12-01 NOTE — ED Provider Notes (Signed)
[place] DEPT MHP Provider Note   CSN: [id number] Arrival date & time: 12/01/16  2028   By signing my name below, I, Neta Mends, attest that this documentation has been prepared under the direction and in the presence of Davonna Belling, MD . Electronically Signed: Neta Mends, ED Scribe. 12/01/2016. 10:11 PM.   History   Chief Complaint Chief Complaint  Patient presents with  . Hematuria   The history is provided by the patient. No language interpreter was used.   HPI Comments:  Harry Arnold is a 51 y.o. male who presents to the Emergency Department complaining of constant bilateral flank pain that began this afternoon. Pt had a kidney stone removed 4 days ago and had a stent placed. Pt complains of associated penile pain, hematuria and nausea. No alleviating factors noted.  Pt denies fever, chills.    Past Medical History:  Diagnosis Date  . 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 Chest pain 04/2016  . Diverticulitis   . GERD (gastroesophageal reflux disease)   . Hypertension   . Kidney stones     Patient Active Problem List   Diagnosis Date Noted  . Elevated blood pressure reading 07/17/2016  . Chest pain 04/24/2016  . Unstable angina ([place])   . Pain in the chest 04/23/2016  . Colitis 02/15/2012  . Abdominal pain, acute 02/15/2012    Past Surgical History:  Procedure Laterality Date  . CARDIAC CATHETERIZATION N/A 04/24/2016   Procedure: Left Heart Cath and Coronary Angiography;  Surgeon: Jettie Booze, MD;  Location: [place] [facility];  Service: Cardiovascular;  Laterality: N/A;  . KNEE ARTHROSCOPY    . testicular cancer         Home Medications    Prior to Admission medications   Medication Sig Start Date End Date Taking? Authorizing Provider  acetaminophen (TYLENOL) 500 MG tablet Take 2 tablets (1,000 mg total) by mouth every 6 (six) hours as needed. 07/07/16   Charlesetta Shanks, MD  amLODipine (NORVASC) 5 MG tablet Take 1 tablet (5  mg total) by mouth daily. 10/27/16   Micheline Chapman, NP  aspirin 81 MG tablet Take 81 mg by mouth daily.    Historical Provider, MD  atorvastatin (LIPITOR) 40 MG tablet Take 1 tablet (40 mg total) by mouth daily at 6 PM. 06/17/16   Micheline Chapman, NP  fish oil-omega-3 fatty acids 1000 MG capsule Take 3 g by mouth daily.    Historical Provider, MD  hydrochlorothiazide (HYDRODIURIL) 25 MG tablet Take 1 tablet (25 mg total) by mouth daily. 07/01/16   Micheline Chapman, NP  HYDROcodone-acetaminophen (NORCO/VICODIN) 5-325 MG per tablet Take 2 tablets by mouth every 4 (four) hours as needed for pain. Patient not taking: Reported on 06/17/2016 08/16/12   Leonard Schwartz, MD  lisinopril (PRINIVIL,ZESTRIL) 20 MG tablet Take 1 tablet (20 mg total) by mouth daily. 06/17/16   Micheline Chapman, NP  pantoprazole (PROTONIX) 40 MG tablet Take 1 tablet (40 mg total) by mouth daily. 06/17/16   Micheline Chapman, NP    Family History No family history on file.  Social History Social History  Substance Use Topics  . Smoking status: Never Smoker  . Smokeless tobacco: Never Used  . Alcohol use No     Allergies   Ibuprofen and Flexeril [cyclobenzaprine hcl]   Review of Systems Review of Systems  Constitutional: Negative for chills and fever.  Gastrointestinal: Positive for nausea.  Genitourinary: Positive for flank pain,  hematuria and penile pain.  All other systems reviewed and are negative.    Physical Exam Updated Vital Signs BP 105/78 (BP Location: Left Arm)   Pulse 86   Temp 98.7 F (37.1 C) (Oral)   Resp 18   Ht 5\' 10"  (1.778 m)   Wt 233 lb (105.7 kg)   SpO2 98%   BMI 33.43 kg/m   Physical Exam  Constitutional: He appears well-developed and well-nourished. No distress.  HENT:  Head: Normocephalic and atraumatic.  Eyes: Conjunctivae are normal.  Cardiovascular: Normal rate.   Pulmonary/Chest: Effort normal.  Abdominal: He exhibits no distension. There is no tenderness.  No  CVA tenderness.  Genitourinary:  Genitourinary Comments: Strings out of the tip of the penis.  Neurological: He is alert.  Skin: Skin is warm and dry.  Psychiatric: He has a normal mood and affect.  Nursing note and vitals reviewed.    ED Treatments / Results  Labs (all labs ordered are listed, but only abnormal results are displayed) Labs Reviewed  URINALYSIS, ROUTINE W REFLEX MICROSCOPIC - Abnormal; Notable for the following:       Result Value   Color, Urine RED (*)    APPearance TURBID (*)    Glucose, UA   (*)    Value: TEST NOT REPORTED DUE TO COLOR INTERFERENCE OF URINE PIGMENT   Hgb urine dipstick   (*)    Value: TEST NOT REPORTED DUE TO COLOR INTERFERENCE OF URINE PIGMENT   Bilirubin Urine   (*)    Value: TEST NOT REPORTED DUE TO COLOR INTERFERENCE OF URINE PIGMENT   Ketones, ur   (*)    Value: TEST NOT REPORTED DUE TO COLOR INTERFERENCE OF URINE PIGMENT   Protein, ur   (*)    Value: TEST NOT REPORTED DUE TO COLOR INTERFERENCE OF URINE PIGMENT   Nitrite   (*)    Value: TEST NOT REPORTED DUE TO COLOR INTERFERENCE OF URINE PIGMENT   Leukocytes, UA   (*)    Value: TEST NOT REPORTED DUE TO COLOR INTERFERENCE OF URINE PIGMENT   All other components within normal limits  URINALYSIS, MICROSCOPIC (REFLEX) - Abnormal; Notable for the following:    Bacteria, UA MANY (*)    Squamous Epithelial / LPF 0-5 (*)    All other components within normal limits  BASIC METABOLIC PANEL - Abnormal; Notable for the following:    Glucose, Bld 122 (*)    BUN 23 (*)    Creatinine, Ser 1.26 (*)    Calcium 8.4 (*)    All other components within normal limits  CBC WITH DIFFERENTIAL/PLATELET - Abnormal; Notable for the following:    WBC 10.9 (*)    RBC 4.00 (*)    HCT 37.9 (*)    All other components within normal limits    EKG  EKG Interpretation None       Radiology Dg Abdomen 1 View  Result Date: 12/01/2016 CLINICAL DATA:  Status post lithotripsy and stent placement on the  left. Gross hematuria and bilateral flank pain for 6 hours. Initial encounter. EXAM: ABDOMEN - 1 VIEW COMPARISON:  CT of the abdomen and pelvis from 11/24/2016 FINDINGS: A left ureteral stent is noted in expected position. The previously noted left ureteral stone is difficult to fully characterize. Tiny left renal stones are noted. The visualized bowel gas pattern is unremarkable. Scattered air and stool filled loops of colon are seen; no abnormal dilatation of small bowel loops is seen to suggest small bowel  obstruction. No free intra-abdominal air is identified, though evaluation for free air is limited on a single supine view. The visualized osseous structures are within normal limits; the sacroiliac joints are unremarkable in appearance. IMPRESSION: Left ureteral stent noted in expected position. Previously noted left ureteral stone is not well seen. Tiny left renal stones again noted. Electronically Signed   By: Garald Balding M.D.   On: 12/01/2016 23:10    Procedures Procedures (including critical care time)  Medications Ordered in ED Medications - No data to display   Initial Impression / Assessment and Plan / ED Course  I have reviewed the triage vital signs and the nursing notes.  Pertinent labs & imaging results that were available during my care of the patient were reviewed by me and considered in my medical decision making (see chart for details).     Patient hematuria. Recent lithotripsy and stent. No fever. Pain overall is not too bad. Does not have clear infection. Does have some bacteria but has not had fevers. Also has gross hematuria. X-ray shows stent in place and some small renal stones. Has follow-up with urology in 2 days but will call tomorrow. Will discharge home.  Final Clinical Impressions(s) / ED Diagnoses   Final diagnoses:  Hematuria, unspecified type    New Prescriptions New Prescriptions   No medications on file  I personally performed the services described  in this documentation, which was scribed in my presence. The recorded information has been reviewed and is accurate.       Davonna Belling, MD 12/01/16 [phone]

## 2017-02-25 ENCOUNTER — Encounter (HOSPITAL_BASED_OUTPATIENT_CLINIC_OR_DEPARTMENT_OTHER): Payer: Self-pay | Admitting: *Deleted

## 2017-02-25 ENCOUNTER — Emergency Department (HOSPITAL_BASED_OUTPATIENT_CLINIC_OR_DEPARTMENT_OTHER)
Admission: EM | Admit: 2017-02-25 | Discharge: 2017-02-25 | Disposition: A | Discharge status: Home / Self Care | Payer: Self-pay | Attending: Emergency Medicine | Admitting: Emergency Medicine

## 2017-02-25 DIAGNOSIS — M5417 Radiculopathy, lumbosacral region: Secondary | ICD-10-CM | POA: Insufficient documentation

## 2017-02-25 DIAGNOSIS — I10 Essential (primary) hypertension: Secondary | ICD-10-CM | POA: Insufficient documentation

## 2017-02-25 DIAGNOSIS — Z79899 Other long term (current) drug therapy: Secondary | ICD-10-CM | POA: Insufficient documentation

## 2017-02-25 LAB — URINALYSIS, ROUTINE W REFLEX MICROSCOPIC
BILIRUBIN URINE: NEGATIVE
Glucose, UA: NEGATIVE mg/dL
HGB URINE DIPSTICK: NEGATIVE
Ketones, ur: NEGATIVE mg/dL
Leukocytes, UA: NEGATIVE
Nitrite: NEGATIVE
Protein, ur: NEGATIVE mg/dL
SPECIFIC GRAVITY, URINE: 1.02 (ref 1.005–1.030)
pH: 5.5 (ref 5.0–8.0)

## 2017-02-25 MED ORDER — OXYCODONE-ACETAMINOPHEN 5-325 MG PO TABS
1.0000 | ORAL_TABLET | Freq: Once | ORAL | Status: AC
  Administered 2017-02-25: 1 via ORAL
  Filled 2017-02-25: qty 1

## 2017-02-25 MED ORDER — TRAMADOL HCL 50 MG PO TABS: 50.0000 mg | ORAL_TABLET | Freq: Four times a day (QID) | ORAL | 0 refills | Status: DC | PRN

## 2017-02-25 MED ORDER — PREDNISONE 10 MG (21) PO TBPK: ORAL_TABLET | Freq: Every day | ORAL | 0 refills | Status: DC

## 2017-02-25 MED ORDER — DEXAMETHASONE SODIUM PHOSPHATE 10 MG/ML IJ SOLN
10.0000 mg | Freq: Once | INTRAMUSCULAR | Status: AC
  Administered 2017-02-25: 10 mg via INTRAMUSCULAR
  Filled 2017-02-25: qty 1

## 2017-02-25 MED ORDER — METHOCARBAMOL 500 MG PO TABS
1000.0000 mg | ORAL_TABLET | Freq: Once | ORAL | Status: AC
  Administered 2017-02-25: 1000 mg via ORAL
  Filled 2017-02-25: qty 2

## 2017-02-25 MED ORDER — METHOCARBAMOL 500 MG PO TABS: 500.0000 mg | ORAL_TABLET | Freq: Two times a day (BID) | ORAL | 0 refills | Status: DC

## 2017-02-25 NOTE — ED Triage Notes (Signed)
Pt with lower back pain that radiates down left leg associated with numbness and tingling in right leg. Sx started 24 hours ago.

## 2017-02-25 NOTE — ED Provider Notes (Signed)
[place] DEPT M[facility] Provider Note   CSN: [id number] Arrival date & time: 02/25/17  2014  By signing my name below, I, Levester Fresh, attest that this documentation has been prepared under the direction and in the presence of Mark Benecke, PA-C. Electronically Signed: Levester Fresh, Scribe. 02/25/2017. 10:36 PM.   History   Chief Complaint Chief Complaint  Patient presents with  . Back Pain    HPI Comments Harry Arnold is a 51 y.o. male with a PMHx significant for HTN, kidney stones, who presents to the Emergency Department with complaints of severe lower back pain x1 day.  Associated right leg numbness and tingling to his toes.  No numbness.  No bowel or bladder incontinence.  Pt was at rest when sx began.  They have been constant since onset, currently rated a 10/10 in severity.  No hx of similar pain and does not feel like kidney stone pain.  No abdominal pain, fever, dysuria.  No hx of DM.  Pain not relieved by percocet, taken last night, which he had leftover from a previous hospital visit.  Pt has not attempted any OTC symptomatic management before presenting today.  Pt has been able to ambulate normally. He denies experiencing any other acute sx.  The history is provided by the patient and medical records. No language interpreter was used.    Past Medical History:  Diagnosis Date  . 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 Chest pain 04/2016  . Diverticulitis   . GERD (gastroesophageal reflux disease)   . Hypertension   . Kidney stones     Patient Active Problem List   Diagnosis Date Noted  . Elevated blood pressure reading 07/17/2016  . Chest pain 04/24/2016  . Unstable angina ([place])   . Pain in the chest 04/23/2016  . Colitis 02/15/2012  . Abdominal pain, acute 02/15/2012    Past Surgical History:  Procedure Laterality Date  . CARDIAC CATHETERIZATION N/A 04/24/2016   Procedure: Left Heart Cath and Coronary Angiography;  Surgeon: Jettie Booze, MD;   Location: [place] [facility];  Service: Cardiovascular;  Laterality: N/A;  . KNEE ARTHROSCOPY    . testicular cancer         Home Medications    Prior to Admission medications   Medication Sig Start Date End Date Taking? Authorizing Provider  acetaminophen (TYLENOL) 500 MG tablet Take 2 tablets (1,000 mg total) by mouth every 6 (six) hours as needed. 07/07/16   Charlesetta Shanks, MD    Family History History reviewed. No pertinent family history.  Social History Social History  Substance Use Topics  . Smoking status: Never Smoker  . Smokeless tobacco: Never Used  . Alcohol use No     Allergies   Ibuprofen and Flexeril [cyclobenzaprine hcl]   Review of Systems Review of Systems  Constitutional: Negative for chills and fever.  Gastrointestinal: Negative for abdominal pain.  Genitourinary: Negative for dysuria, flank pain and hematuria.  Musculoskeletal: Positive for back pain.  Neurological: Positive for numbness. Negative for weakness.  All other systems reviewed and are negative.    Physical Exam Updated Vital Signs BP (!) 141/104   Pulse 91   Temp 98.9 F (37.2 C)   Resp 20   Ht 5\' 10"  (1.778 m)   Wt 238 lb (108 kg)   SpO2 96%   BMI 34.15 kg/m   Physical Exam  Constitutional: He is oriented to person, place, and time. He appears well-developed and well-nourished.  HENT:  Head: Normocephalic and atraumatic.  Eyes: Conjunctivae are normal.  Neck: Normal range of motion.  Pulmonary/Chest: Effort normal.  Abdominal: Bowel sounds are normal. There is no tenderness.  Musculoskeletal: Normal range of motion. He exhibits no edema or deformity.  Tentative palpation over midline and bilateral paraspinal lumbar muscles. Pain with right straight leg raise.  Neurological: He is alert and oriented to person, place, and time. No cranial nerve deficit or sensory deficit. He exhibits normal muscle tone. Coordination normal.  5/5 and equal lower extremity strength. 2+  and equal patellar reflexes bilaterally. Pt able to dorsiflex bilateral toes and feet with good strength against resistance. Equal sensation bilaterally over thighs and lower legs.   Skin: Skin is warm and dry. Capillary refill takes less than 2 seconds.  Psychiatric: He has a normal mood and affect.  Nursing note and vitals reviewed.    ED Treatments / Results  DIAGNOSTIC STUDIES: Oxygen Saturation is 96% on room air, adequate by my interpretation.    COORDINATION OF CARE: 9:42 PM Discussed treatment plan with pt at bedside and pt agreed to plan.  10:35 PM On re-examination, pt reports that he is feeling better and in no pain.   Labs (all labs ordered are listed, but only abnormal results are displayed) Labs Reviewed  URINALYSIS, ROUTINE W REFLEX MICROSCOPIC    EKG  EKG Interpretation None       Radiology No results found.  Procedures Procedures (including critical care time)  Medications Ordered in ED Medications  dexamethasone (DECADRON) injection 10 mg (10 mg Intramuscular Given 02/25/17 2203)  oxyCODONE-acetaminophen (PERCOCET/ROXICET) 5-325 MG per tablet 1 tablet (1 tablet Oral Given 02/25/17 2203)  methocarbamol (ROBAXIN) tablet 1,000 mg (1,000 mg Oral Given 02/25/17 2202)     Initial Impression / Assessment and Plan / ED Course  I have reviewed the triage vital signs and the nursing notes.  Pertinent labs & imaging results that were available during my care of the patient were reviewed by me and considered in my medical decision making (see chart for details).     Patient with acute on chronic lower back pain with some numbness in the right leg. His sensation however is intact on exam. He has no weakness in the leg. No trouble controlling bowels or urine. No incontinence. No fever. Abdominal pain. Urinalysis is negative. Patient has history of kidney stones, this does not feel the same. Suspect radicular pain, possibly sciatica versus nerve impingement. Will  treat NAD with Percocet, Robaxin, Toradol.    10:48 PM Patient is feeling much better after medications. Will discharge home with prednisone, Robaxin, tramadol. Follow up with primary care doctor for recheck. Return precautions discussed.  Vitals:   02/25/17 2022  BP: (!) 141/104  Pulse: 91  Resp: 20  Temp: 98.9 F (37.2 C)  SpO2: 96%  Weight: 108 kg (238 lb)  Height: 5\' 10"  (1.778 m)     Final Clinical Impressions(s) / ED Diagnoses   Final diagnoses:  Lumbosacral radiculopathy   I personally performed the services described in this documentation, which was scribed in my presence. The recorded information has been reviewed and is accurate.   New Prescriptions New Prescriptions   No medications on file     Jeannett Senior, Hershal Coria 02/25/17 2250    Gareth Morgan, MD 02/26/17 1309

## 2017-02-25 NOTE — ED Notes (Signed)
ED Provider at bedside. 

## 2017-02-25 NOTE — ED Notes (Signed)
Pt states he already gave a urine. EMT will check with lab.

## 2017-02-25 NOTE — Discharge Instructions (Signed)
Take prednisone as prescribed until gone. Take Robaxin for muscle spasms. Take tramadol for severe pain. You can take Tylenol in addition for pain relief. Please follow-up with family doctor for recheck of further treatment.

## 2017-02-25 NOTE — ED Notes (Signed)
Family at bedside. 

## 2017-04-28 ENCOUNTER — Ambulatory Visit: Payer: Self-pay | Admitting: Family Medicine

## 2017-12-28 ENCOUNTER — Emergency Department (HOSPITAL_BASED_OUTPATIENT_CLINIC_OR_DEPARTMENT_OTHER)
Admission: EM | Admit: 2017-12-28 | Discharge: 2017-12-29 | Disposition: A | Discharge status: Home / Self Care | Payer: Self-pay | Attending: Emergency Medicine | Admitting: Emergency Medicine

## 2017-12-28 ENCOUNTER — Other Ambulatory Visit: Payer: Self-pay

## 2017-12-28 ENCOUNTER — Encounter (HOSPITAL_BASED_OUTPATIENT_CLINIC_OR_DEPARTMENT_OTHER): Payer: Self-pay | Admitting: *Deleted

## 2017-12-28 DIAGNOSIS — I10 Essential (primary) hypertension: Secondary | ICD-10-CM | POA: Insufficient documentation

## 2017-12-28 DIAGNOSIS — K0889 Other specified disorders of teeth and supporting structures: Secondary | ICD-10-CM | POA: Insufficient documentation

## 2017-12-28 MED ORDER — LIDOCAINE VISCOUS 2 % MT SOLN: 15.0000 mL | OROMUCOSAL | 2 refills | Status: DC | PRN

## 2017-12-28 MED ORDER — PENICILLIN V POTASSIUM 250 MG PO TABS
500.0000 mg | ORAL_TABLET | Freq: Once | ORAL | Status: AC
  Administered 2017-12-28: 500 mg via ORAL
  Filled 2017-12-28: qty 2

## 2017-12-28 MED ORDER — PENICILLIN V POTASSIUM 500 MG PO TABS: 500.0000 mg | ORAL_TABLET | Freq: Four times a day (QID) | ORAL | 0 refills | Status: AC

## 2017-12-28 NOTE — Discharge Instructions (Addendum)
°  Dental Pain You have been seen today for dental pain. You should follow up with a dentist as soon as possible. This problem will not resolve on its own without the care of a dentist. Use ibuprofen or naproxen for pain. Use the viscous lidocaine for mouth pain. Swish with the lidocaine and spit it out. Do not swallow it.  Antiinflammatory medications: Take 600 mg of ibuprofen every 6 hours or 440 mg (over the counter dose) to 500 mg (prescription dose) of naproxen every 12 hours for the next 3 days. After this time, these medications may be used as needed for pain. Take these medications with food to avoid upset stomach. Choose only one of these medications, do not take them together. Tylenol: Should you continue to have additional pain while taking the ibuprofen or naproxen, you may add in tylenol as needed. Your daily total maximum amount of tylenol from all sources should be limited to 4000mg /day for persons without liver problems, or 2000mg /day for those with liver problems.  Please take all of your antibiotics until finished!   You may develop abdominal discomfort or diarrhea from the antibiotic.  You may help offset this with probiotics which you can buy or get in yogurt. Do not eat or take the probiotics until 2 hours after your antibiotic.   [place], [address] [place], [place] [address] [phone]  [place], [place] [address] [phone]  [place] [address] [place], [place] [address] [phone] ext. [place] [address], [place] [address] [place], [place] [address] [phone]  [facility] [address] [place], [place] [address] [phone]  [facility] [employer] [email or web address]  [place] [address] [place], [place] [address] [phone]  Website for free, low-income, or sliding scale dental services in [place]: www.[email or web address].us  To find a dentist in [place] and surrounding areas: [email or web address].[email or web address]  [facility] [email or web address]  [facility] Medicaid Dentist [email or web address]

## 2017-12-28 NOTE — ED Triage Notes (Signed)
Dental abscess with swelling to the right side of his mouth.

## 2017-12-28 NOTE — ED Provider Notes (Signed)
[facility] EMERGENCY DEPARTMENT Provider Note   CSN: [id number] Arrival date & time: 12/28/17  1959     History   Chief Complaint Chief Complaint  Patient presents with  . Dental Pain    HPI Harry Arnold is a 52 y.o. male.  HPI   Harry Arnold is a 52 y.o. male, with a history of HTN, presenting to the ED with right lower dental pain beginning yesterday.  Swelling beginning this morning.  Subjective fever.  Has been taking Tylenol for pain.  Current pain is aching, moderate to severe, nonradiating.  Denies nausea/vomiting, difficulty breathing or swallowing, headache, or any other complaints.   Past Medical History:  Diagnosis Date  . 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 Chest pain 04/2016  . Diverticulitis   . GERD (gastroesophageal reflux disease)   . Hypertension   . Kidney stones     Patient Active Problem List   Diagnosis Date Noted  . Elevated blood pressure reading 07/17/2016  . Chest pain 04/24/2016  . Unstable angina ([place])   . Pain in the chest 04/23/2016  . Colitis 02/15/2012  . Abdominal pain, acute 02/15/2012    Past Surgical History:  Procedure Laterality Date  . CARDIAC CATHETERIZATION N/A 04/24/2016   Procedure: Left Heart Cath and Coronary Angiography;  Surgeon: Jettie Booze, MD;  Location: [facility];  Service: Cardiovascular;  Laterality: N/A;  . KNEE ARTHROSCOPY    . testicular cancer          Home Medications    Prior to Admission medications   Medication Sig Start Date End Date Taking? Authorizing Provider  acetaminophen (TYLENOL) 500 MG tablet Take 2 tablets (1,000 mg total) by mouth every 6 (six) hours as needed. 07/07/16   Charlesetta Shanks, MD  lidocaine (XYLOCAINE) 2 % solution Use as directed 15 mLs in the mouth or throat as needed for mouth pain. 12/28/17   Niquita Digioia C, PA-C  methocarbamol (ROBAXIN) 500 MG tablet Take 1 tablet (500 mg total) by mouth 2 (two) times daily. 02/25/17   Kirichenko, Lahoma Rocker, PA-C   penicillin v potassium (VEETID) 500 MG tablet Take 1 tablet (500 mg total) by mouth 4 (four) times daily for 7 days. 12/28/17 01/04/18  Rema Lievanos C, PA-C  predniSONE (STERAPRED UNI-PAK 21 TAB) 10 MG (21) TBPK tablet Take by mouth daily. Take 6 tabs by mouth daily  for 2 days, then 5 tabs for 2 days, then 4 tabs for 2 days, then 3 tabs for 2 days, 2 tabs for 2 days, then 1 tab by mouth daily for 2 days 02/25/17   Jeannett Senior, PA-C  traMADol (ULTRAM) 50 MG tablet Take 1 tablet (50 mg total) by mouth every 6 (six) hours as needed. 02/25/17   Jeannett Senior, PA-C    Family History No family history on file.  Social History Social History   Tobacco Use  . Smoking status: Never Smoker  . Smokeless tobacco: Never Used  Substance Use Topics  . Alcohol use: No  . Drug use: No     Allergies   Ibuprofen and Flexeril [cyclobenzaprine hcl]   Review of Systems Review of Systems  Constitutional: Positive for fever (subjective).  HENT: Positive for dental problem and facial swelling. Negative for drooling, trouble swallowing and voice change.   Respiratory: Negative for shortness of breath.   Gastrointestinal: Negative for nausea and vomiting.  Musculoskeletal: Negative for neck pain.     Physical Exam Updated Vital Signs BP Mar[place] Kitchen)  177/104   Pulse 82   Temp 98.8 F (37.1 C) (Oral)   Resp 20   Ht 5\' 10"  (1.778 m)   Wt 108 kg (238 lb)   SpO2 99%   BMI 34.15 kg/m   Physical Exam  Constitutional: He appears well-developed and well-nourished. No distress.  HENT:  Head: Normocephalic and atraumatic.  Right lower facial swelling. Tenderness to gingiva adjacent to right mandibular premolars.  Erosion noted to these teeth.  No area of fluctuance noted. No trismus.  Mouth opening to at least 3 finger widths.  Handles oral secretions without difficulty.  No swelling or tenderness to the submental or submandibular regions.  No swelling or tenderness into the soft tissues of the neck.   Eyes: Conjunctivae are normal.  Neck: Normal range of motion. Neck supple.  Cardiovascular: Normal rate and regular rhythm.  Pulmonary/Chest: Effort normal.  Lymphadenopathy:    He has no cervical adenopathy.  Neurological: He is alert.  Skin: Skin is warm and dry. He is not diaphoretic. No pallor.  Psychiatric: He has a normal mood and affect. His behavior is normal.  Nursing note and vitals reviewed.    ED Treatments / Results  Labs (all labs ordered are listed, but only abnormal results are displayed) Labs Reviewed - No data to display  EKG None  Radiology No results found.  Procedures Procedures (including critical care time)  Medications Ordered in ED Medications  penicillin v potassium (VEETID) tablet 500 mg (500 mg Oral Given 12/28/17 2252)     Initial Impression / Assessment and Plan / ED Course  I have reviewed the triage vital signs and the nursing notes.  Pertinent labs & imaging results that were available during my care of the patient were reviewed by me and considered in my medical decision making (see chart for details).     Patient presents with right lower dental pain.  Doubt sepsis or Ludwig's angioedema.  No noted abscess that would require bedside I&D.  Patient was offered a dental block with explanation, but declined.  Antibiotic therapy initiated.  Dental follow-up recommended.  Resources given. The patient was given instructions for home care as well as return precautions. Patient voices understanding of these instructions, accepts the plan, and is comfortable with discharge.   Vitals:   12/28/17 2005 12/28/17 2007 12/28/17 2212 12/28/17 2251  BP:  (!) 153/96 (!) 177/104 (!) 150/89  Pulse:  87 82 82  Resp:  16 20 20   Temp:  98.8 F (37.1 C)    TempSrc:  Oral    SpO2:  99% 99% 97%  Weight: 108 kg (238 lb)     Height: 5\' 10"  (1.778 m)        Final Clinical Impressions(s) / ED Diagnoses   Final diagnoses:  Pain, dental    ED  Discharge Orders        Ordered    penicillin v potassium (VEETID) 500 MG tablet  4 times daily     12/28/17 2244    lidocaine (XYLOCAINE) 2 % solution  As needed     12/28/17 2244       Lorayne Bender, PA-C 12/29/17 0039    Sherwood Gambler, MD 12/29/17 1013

## 2018-06-02 ENCOUNTER — Other Ambulatory Visit: Payer: Self-pay

## 2018-06-02 ENCOUNTER — Encounter (HOSPITAL_BASED_OUTPATIENT_CLINIC_OR_DEPARTMENT_OTHER): Payer: Self-pay | Admitting: *Deleted

## 2018-06-02 ENCOUNTER — Emergency Department (HOSPITAL_BASED_OUTPATIENT_CLINIC_OR_DEPARTMENT_OTHER)
Admission: EM | Admit: 2018-06-02 | Discharge: 2018-06-02 | Disposition: A | Discharge status: Home / Self Care | Payer: Self-pay | Attending: Emergency Medicine | Admitting: Emergency Medicine

## 2018-06-02 ENCOUNTER — Emergency Department (HOSPITAL_BASED_OUTPATIENT_CLINIC_OR_DEPARTMENT_OTHER): Payer: Self-pay

## 2018-06-02 DIAGNOSIS — Z79899 Other long term (current) drug therapy: Secondary | ICD-10-CM | POA: Insufficient documentation

## 2018-06-02 DIAGNOSIS — N12 Tubulo-interstitial nephritis, not specified as acute or chronic: Secondary | ICD-10-CM

## 2018-06-02 DIAGNOSIS — I10 Essential (primary) hypertension: Secondary | ICD-10-CM | POA: Insufficient documentation

## 2018-06-02 DIAGNOSIS — N118 Other chronic tubulo-interstitial nephritis: Secondary | ICD-10-CM | POA: Insufficient documentation

## 2018-06-02 LAB — CBC
HEMATOCRIT: 41.2 % (ref 39.0–52.0)
Hemoglobin: 14.1 g/dL (ref 13.0–17.0)
MCH: 32 pg (ref 26.0–34.0)
MCHC: 34.2 g/dL (ref 30.0–36.0)
MCV: 93.4 fL (ref 78.0–100.0)
Platelets: 197 10*3/uL (ref 150–400)
RBC: 4.41 MIL/uL (ref 4.22–5.81)
RDW: 13.1 % (ref 11.5–15.5)
WBC: 7.5 10*3/uL (ref 4.0–10.5)

## 2018-06-02 LAB — URINALYSIS, ROUTINE W REFLEX MICROSCOPIC
Bilirubin Urine: NEGATIVE
GLUCOSE, UA: NEGATIVE mg/dL
Hgb urine dipstick: NEGATIVE
Ketones, ur: NEGATIVE mg/dL
LEUKOCYTES UA: NEGATIVE
NITRITE: NEGATIVE
PROTEIN: NEGATIVE mg/dL
Specific Gravity, Urine: 1.015 (ref 1.005–1.030)
pH: 6 (ref 5.0–8.0)

## 2018-06-02 LAB — BASIC METABOLIC PANEL
ANION GAP: 8 (ref 5–15)
BUN: 15 mg/dL (ref 6–20)
CHLORIDE: 104 mmol/L (ref 98–111)
CO2: 27 mmol/L (ref 22–32)
Calcium: 8.8 mg/dL — ABNORMAL LOW (ref 8.9–10.3)
Creatinine, Ser: 1.12 mg/dL (ref 0.61–1.24)
GFR calc Af Amer: >60 mL/min (ref >60)
GFR calc non Af Amer: >60 mL/min (ref >60)
GLUCOSE: 85 mg/dL (ref 70–99)
POTASSIUM: 3.8 mmol/L (ref 3.5–5.1)
Sodium: 139 mmol/L (ref 135–145)

## 2018-06-02 MED ORDER — HYDROMORPHONE HCL 1 MG/ML IJ SOLN
1.0000 mg | Freq: Once | INTRAMUSCULAR | Status: AC
  Administered 2018-06-02: 1 mg via INTRAVENOUS
  Filled 2018-06-02: qty 1

## 2018-06-02 MED ORDER — SODIUM CHLORIDE 0.9 % IV SOLN
1.0000 g | Freq: Once | INTRAVENOUS | Status: AC
  Administered 2018-06-02: 1 g via INTRAVENOUS
  Filled 2018-06-02: qty 10

## 2018-06-02 MED ORDER — CEPHALEXIN 500 MG PO CAPS: 500.0000 mg | ORAL_CAPSULE | Freq: Three times a day (TID) | ORAL | 0 refills | Status: DC

## 2018-06-02 MED ORDER — ONDANSETRON HCL 4 MG/2ML IJ SOLN
4.0000 mg | Freq: Once | INTRAMUSCULAR | Status: AC
  Administered 2018-06-02: 4 mg via INTRAVENOUS
  Filled 2018-06-02: qty 2

## 2018-06-02 MED ORDER — HYDROCODONE-ACETAMINOPHEN 5-325 MG PO TABS: 1.0000 | ORAL_TABLET | ORAL | 0 refills | Status: DC | PRN

## 2018-06-02 MED ORDER — SODIUM CHLORIDE 0.9 % IV BOLUS
500.0000 mL | Freq: Once | INTRAVENOUS | Status: AC
  Administered 2018-06-02: 500 mL via INTRAVENOUS

## 2018-06-02 MED ORDER — ONDANSETRON 8 MG PO TBDP: 8.0000 mg | ORAL_TABLET | Freq: Three times a day (TID) | ORAL | 0 refills | Status: DC | PRN

## 2018-06-02 MED ORDER — SODIUM CHLORIDE 0.9 % IV SOLN
INTRAVENOUS | Status: DC | PRN
  Administered 2018-06-02: 500 mL via INTRAVENOUS

## 2018-06-02 MED FILL — ONDANSETRON ODT 8 MG TABLET: 8 | 3 days supply | Qty: 10 | Fill #0

## 2018-06-02 MED FILL — CEPHALEXIN 500 MG CAPSULE: 500 | 7 days supply | Qty: 21 | Fill #0

## 2018-06-02 MED FILL — HYDROCODON-APAP 5-325: 5-325 | 2 days supply | Qty: 12 | Fill #0

## 2018-06-02 NOTE — ED Provider Notes (Signed)
[facility] EMERGENCY DEPARTMENT Provider Note   CSN: [id number] Arrival date & time: 06/02/18  1015     History   Chief Complaint Chief Complaint  Patient presents with  . Back Pain    HPI Md Smola is a 52 y.o. male.  HPI 52 year old male presents the emergency department with complaints of bilateral flank pain right greater than left with some dysuria and urinary frequency.  He reports a darker than normal urine and a foul smell to his urine.  He has had a urinary tract infection before.  He denies fevers and chills.  Reports nausea and vomiting today.  He denies chest pain or shortness of breath.  No other complaints.  Symptoms are moderate in severity.   Past Medical History:  Diagnosis Date  . 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 Chest pain 04/2016  . Diverticulitis   . GERD (gastroesophageal reflux disease)   . Hypertension   . Kidney stones     Patient Active Problem List   Diagnosis Date Noted  . Elevated blood pressure reading 07/17/2016  . Chest pain 04/24/2016  . Unstable angina ([place])   . Pain in the chest 04/23/2016  . Colitis 02/15/2012  . Abdominal pain, acute 02/15/2012    Past Surgical History:  Procedure Laterality Date  . CARDIAC CATHETERIZATION N/A 04/24/2016   Procedure: Left Heart Cath and Coronary Angiography;  Surgeon: Jettie Booze, MD;  Location: [facility];  Service: Cardiovascular;  Laterality: N/A;  . KNEE ARTHROSCOPY    . testicular cancer          Home Medications    Prior to Admission medications   Medication Sig Start Date End Date Taking? Authorizing Provider  acetaminophen (TYLENOL) 500 MG tablet Take 2 tablets (1,000 mg total) by mouth every 6 (six) hours as needed. 07/07/16   Charlesetta Shanks, MD  lidocaine (XYLOCAINE) 2 % solution Use as directed 15 mLs in the mouth or throat as needed for mouth pain. 12/28/17   Joy, Shawn C, PA-C  methocarbamol (ROBAXIN) 500 MG tablet Take 1 tablet (500 mg  total) by mouth 2 (two) times daily. 02/25/17   Kirichenko, Tatyana, PA-C  predniSONE (STERAPRED UNI-PAK 21 TAB) 10 MG (21) TBPK tablet Take by mouth daily. Take 6 tabs by mouth daily  for 2 days, then 5 tabs for 2 days, then 4 tabs for 2 days, then 3 tabs for 2 days, 2 tabs for 2 days, then 1 tab by mouth daily for 2 days 02/25/17   Jeannett Senior, PA-C  traMADol (ULTRAM) 50 MG tablet Take 1 tablet (50 mg total) by mouth every 6 (six) hours as needed. 02/25/17   Jeannett Senior, PA-C    Family History History reviewed. No pertinent family history.  Social History Social History   Tobacco Use  . Smoking status: Never Smoker  . Smokeless tobacco: Never Used  Substance Use Topics  . Alcohol use: No  . Drug use: No     Allergies   Ibuprofen; Naproxen sodium; and Flexeril [cyclobenzaprine hcl]   Review of Systems Review of Systems  All other systems reviewed and are negative.    Physical Exam Updated Vital Signs BP (!) 144/91   Pulse 70   Resp 18   Ht 5\' 10"  (1.778 m)   Wt 113.4 kg   SpO2 100%   BMI 35.87 kg/m   Physical Exam  Constitutional: He is oriented to person, place, and time. He appears well-developed and  well-nourished.  HENT:  Head: Normocephalic and atraumatic.  Eyes: EOM are normal.  Neck: Normal range of motion.  Cardiovascular: Normal rate, regular rhythm, normal heart sounds and intact distal pulses.  Pulmonary/Chest: Effort normal and breath sounds normal. No respiratory distress.  Abdominal: Soft. He exhibits no distension. There is no tenderness.  Musculoskeletal: Normal range of motion.  Neurological: He is alert and oriented to person, place, and time.  Skin: Skin is warm and dry.  Psychiatric: He has a normal mood and affect. Judgment normal.  Nursing note and vitals reviewed.    ED Treatments / Results  Labs (all labs ordered are listed, but only abnormal results are displayed) Labs Reviewed  BASIC METABOLIC PANEL - Abnormal;  Notable for the following components:      Result Value   Calcium 8.8 (*)    All other components within normal limits  URINE CULTURE  URINALYSIS, ROUTINE W REFLEX MICROSCOPIC  CBC    EKG None  Radiology Ct Renal Stone Study  Result Date: 06/02/2018 CLINICAL DATA:  Bilateral flank pain since this morning EXAM: CT ABDOMEN AND PELVIS WITHOUT CONTRAST TECHNIQUE: Multidetector CT imaging of the abdomen and pelvis was performed following the standard protocol without IV contrast. COMPARISON:  November 24, 2016 FINDINGS: Lower chest: The lung bases are clear. Hepatobiliary: Mild diffuse low density of the liver is identified. There is no focal liver lesion. The gallbladder is normal. The biliary tree is normal. Pancreas: Unremarkable. No pancreatic ductal dilatation or surrounding inflammatory changes. Spleen: Normal in size without focal abnormality. Adrenals/Urinary Tract: The bilateral adrenal glands are normal. There is no hydronephrosis bilaterally. There is a question 1 mm stone in the midpole left kidney. Bladder is normal. Stomach/Bowel: There is a small hiatal hernia. The stomach is otherwise normal. The small bowel is normal. There is diverticulosis of colon without diverticulitis. The appendix is normal. Vascular/Lymphatic: No significant vascular findings are present. No enlarged abdominal or pelvic lymph nodes. Reproductive: Prostate is unremarkable. Other: There is small umbilical herniation of mesenteric fat. Musculoskeletal: Minimal degenerative joint changes of the spine are noted. IMPRESSION: Question minimal nonobstructing stone in the left kidney. There is no hydronephrosis bilaterally. No bowel obstruction.  The appendix is normal. Electronically Signed   By: Abelardo Diesel M.D.   On: 06/02/2018 11:48    Procedures Procedures (including critical care time)  Medications Ordered in ED Medications  0.9 %  sodium chloride infusion (500 mLs Intravenous New Bag/Given 06/02/18 1339)    HYDROmorphone (DILAUDID) injection 1 mg (1 mg Intravenous Given 06/02/18 1122)  ondansetron (ZOFRAN) injection 4 mg (4 mg Intravenous Given 06/02/18 1122)  sodium chloride 0.9 % bolus 500 mL ( Intravenous Stopped 06/02/18 1205)  cefTRIAXone (ROCEPHIN) 1 g in sodium chloride 0.9 % 100 mL IVPB (1 g Intravenous New Bag/Given 06/02/18 1341)     Initial Impression / Assessment and Plan / ED Course  I have reviewed the triage vital signs and the nursing notes.  Pertinent labs & imaging results that were available during my care of the patient were reviewed by me and considered in my medical decision making (see chart for details).     Urine culture sent.  CT scan without acute abnormality.  No ureteral stone.  Given his presentation of bilateral flank pain, nausea vomiting, significant dysuria he will be treated as suspected pyelonephritis despite his clean appearing urine.  Urine culture sent.  No other clear etiology found.  Home with antibiotics and nausea medicine and pain medicine.  He understands return to the ER for new or worsening symptoms.  I encouraged him to have his primary care physician follow-up on the urine culture to determine length and appropriateness of treatment with antibiotics.  Final Clinical Impressions(s) / ED Diagnoses   Final diagnoses:  None    ED Discharge Orders    None       Jola Schmidt, MD 06/02/18 1434

## 2018-06-02 NOTE — ED Triage Notes (Signed)
Pt reports low back pain x 1am, awakened him from sleep, feels like his kidney stone pain. Pt states pain is bilateral, also reports dysuria and dark urine, good uop this am.

## 2018-06-03 LAB — URINE CULTURE: CULTURE: NO GROWTH

## 2018-10-03 ENCOUNTER — Encounter (HOSPITAL_BASED_OUTPATIENT_CLINIC_OR_DEPARTMENT_OTHER): Payer: Self-pay | Admitting: Emergency Medicine

## 2018-10-03 ENCOUNTER — Emergency Department (HOSPITAL_BASED_OUTPATIENT_CLINIC_OR_DEPARTMENT_OTHER)
Admission: EM | Admit: 2018-10-03 | Discharge: 2018-10-04 | Disposition: A | Discharge status: Home / Self Care | Payer: Self-pay | Attending: Emergency Medicine | Admitting: Emergency Medicine

## 2018-10-03 ENCOUNTER — Other Ambulatory Visit: Payer: Self-pay

## 2018-10-03 DIAGNOSIS — I10 Essential (primary) hypertension: Secondary | ICD-10-CM | POA: Insufficient documentation

## 2018-10-03 DIAGNOSIS — Z8547 Personal history of malignant neoplasm of testis: Secondary | ICD-10-CM | POA: Insufficient documentation

## 2018-10-03 DIAGNOSIS — J111 Influenza due to unidentified influenza virus with other respiratory manifestations: Secondary | ICD-10-CM | POA: Insufficient documentation

## 2018-10-03 DIAGNOSIS — R69 Illness, unspecified: Secondary | ICD-10-CM

## 2018-10-03 NOTE — ED Notes (Signed)
Pt c/o cough, nasal and chest congestion with generalized body aches x 3 days. Also states he has been wheezing. Pt afebrile at this time, but he states he hs been having chills. Pt denies sob, but states he feels tightness with deep inspiration. Also c/o right eye redness. Denies eye pain, but yesterday he states was itchy with a film. He has been using eye drops that resolved the itching and blurriness.

## 2018-10-03 NOTE — ED Triage Notes (Signed)
Patient states that he has had cough and generalized aches and stuffed up sinus for the last 2 -3 days

## 2018-10-04 MED ORDER — HYDROCOD POLST-CPM POLST ER 10-8 MG/5ML PO SUER: 5.0000 mL | Freq: Two times a day (BID) | ORAL | 0 refills | Status: DC | PRN

## 2018-10-04 MED ORDER — OXYMETAZOLINE HCL 0.05 % NA SOLN
2.0000 | Freq: Two times a day (BID) | NASAL | Status: DC | PRN
  Administered 2018-10-04: 2 via NASAL
  Filled 2018-10-04: qty 15

## 2018-10-04 NOTE — ED Provider Notes (Signed)
[facility] Provider Note: Harry Spurling, MD, FACEP  CSN: [id number] MRN: [id number] ARRIVAL: 10/03/18 at 2202 ROOM: MHFT1/MHFT1   CHIEF COMPLAINT  Generalized Body Aches   HISTORY OF PRESENT ILLNESS  10/04/18 12:12 AM Harry Arnold is a 53 y.o. male with a 2 to 3-day history of flulike symptoms.  Specifically he has had generalized body aches, subjective fever, nasal congestion and cough.  The cough frequently occurs in paroxysms.  He has developed right eye redness as a result of coughing.  He denies nausea, vomiting or diarrhea.  He has been taking Sudafed without relief.    Past Medical History:  Diagnosis Date  . 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 Chest pain 04/2016  . Diverticulitis   . GERD (gastroesophageal reflux disease)   . Hypertension   . Kidney stones     Past Surgical History:  Procedure Laterality Date  . CARDIAC CATHETERIZATION N/A 04/24/2016   Procedure: Left Heart Cath and Coronary Angiography;  Surgeon: Jettie Booze, MD;  Location: [facility];  Service: Cardiovascular;  Laterality: N/A;  . KNEE ARTHROSCOPY    . testicular cancer      History reviewed. No pertinent family history.  Social History   Tobacco Use  . Smoking status: Never Smoker  . Smokeless tobacco: Never Used  Substance Use Topics  . Alcohol use: No  . Drug use: No    Prior to Admission medications   Medication Sig Start Date End Date Taking? Authorizing Provider  cephALEXin (KEFLEX) 500 MG capsule Take 1 capsule (500 mg total) by mouth 3 (three) times daily. 06/02/18   Jola Schmidt, MD  HYDROcodone-acetaminophen (NORCO/VICODIN) 5-325 MG tablet Take 1 tablet by mouth every 4 (four) hours as needed for moderate pain. 06/02/18   Jola Schmidt, MD  ondansetron (ZOFRAN ODT) 8 MG disintegrating tablet Take 1 tablet (8 mg total) by mouth every 8 (eight) hours as needed for nausea or vomiting. 06/02/18   Jola Schmidt, MD    Allergies Ibuprofen; Naproxen  sodium; and Flexeril [cyclobenzaprine hcl]   REVIEW OF SYSTEMS  Negative except as noted here or in the History of Present Illness.   PHYSICAL EXAMINATION  Initial Vital Signs Blood pressure (!) 171/101, pulse 83, temperature 98.6 F (37 C), temperature source Oral, resp. rate 18, height 5\' 10"  (1.778 m), weight 102.1 kg, SpO2 100 %.  Examination General: Well-developed, well-nourished male in no acute distress; appearance consistent with age of record HENT: normocephalic; atraumatic with; nasal congestion; pharynx normal Eyes: pupils equal, round and reactive to light; extraocular muscles intact; right lateral subconjunctival hemorrhage Neck: supple Heart: regular rate and rhythm Lungs: clear to auscultation bilaterally Abdomen: soft; nondistended; nontender; bowel sounds present Extremities: No deformity; full range of motion Neurologic: Awake, alert and oriented; motor function intact in all extremities and symmetric; no facial droop Skin: Warm and dry Psychiatric: Normal mood and affect   RESULTS  Summary of this visit's results, reviewed by myself:   EKG Interpretation  Date/Time:    Ventricular Rate:    PR Interval:    QRS Duration:   QT Interval:    QTC Calculation:   R Axis:     Text Interpretation:        Laboratory Studies: No results found for this or any previous visit (from the past 24 hour(s)). Imaging Studies: No results found.  ED COURSE and MDM  Nursing notes and initial vitals signs, including pulse oximetry, reviewed.  Vitals:  10/03/18 2207 10/03/18 2209  BP:  (!) 171/101  Pulse:  83  Resp:  18  Temp:  98.6 F (37 C)  TempSrc:  Oral  SpO2:  100%  Weight: 102.1 kg   Height: 5\' 10"  (1.778 m)    Patient is out of the window for Tamiflu.  We will treat him symptomatically.  PROCEDURES    ED DIAGNOSES     ICD-10-CM   1. Influenza-like illness R69        Shanon Rosser, MD 10/04/18 [phone]

## 2018-10-04 NOTE — ED Notes (Signed)
Pt understood dc material. NAD noted. Script sent electronically. Doctors excuse given at [employer]

## 2019-05-20 ENCOUNTER — Emergency Department (HOSPITAL_BASED_OUTPATIENT_CLINIC_OR_DEPARTMENT_OTHER)
Admission: EM | Admit: 2019-05-20 | Discharge: 2019-05-20 | Disposition: A | Discharge status: Home / Self Care | Payer: Self-pay | Attending: Emergency Medicine | Admitting: Emergency Medicine

## 2019-05-20 ENCOUNTER — Other Ambulatory Visit: Payer: Self-pay

## 2019-05-20 ENCOUNTER — Encounter (HOSPITAL_BASED_OUTPATIENT_CLINIC_OR_DEPARTMENT_OTHER): Payer: Self-pay | Admitting: Emergency Medicine

## 2019-05-20 ENCOUNTER — Telehealth (HOSPITAL_BASED_OUTPATIENT_CLINIC_OR_DEPARTMENT_OTHER): Payer: Self-pay | Admitting: Emergency Medicine

## 2019-05-20 DIAGNOSIS — R109 Unspecified abdominal pain: Secondary | ICD-10-CM | POA: Insufficient documentation

## 2019-05-20 DIAGNOSIS — R1032 Left lower quadrant pain: Secondary | ICD-10-CM | POA: Insufficient documentation

## 2019-05-20 DIAGNOSIS — Z8547 Personal history of malignant neoplasm of testis: Secondary | ICD-10-CM | POA: Insufficient documentation

## 2019-05-20 DIAGNOSIS — I10 Essential (primary) hypertension: Secondary | ICD-10-CM | POA: Insufficient documentation

## 2019-05-20 DIAGNOSIS — R39198 Other difficulties with micturition: Secondary | ICD-10-CM | POA: Insufficient documentation

## 2019-05-20 LAB — URINALYSIS, ROUTINE W REFLEX MICROSCOPIC
Bilirubin Urine: NEGATIVE
Glucose, UA: NEGATIVE mg/dL
Hgb urine dipstick: NEGATIVE
Ketones, ur: NEGATIVE mg/dL
Leukocytes,Ua: NEGATIVE
Nitrite: NEGATIVE
Protein, ur: NEGATIVE mg/dL
Specific Gravity, Urine: 1.01 (ref 1.005–1.030)
pH: 7.5 (ref 5.0–8.0)

## 2019-05-20 MED ORDER — LORAZEPAM 1 MG PO TABS: 1.0000 mg | ORAL_TABLET | Freq: Once | ORAL | 0 refills | Status: AC | PRN

## 2019-05-20 MED ORDER — ONDANSETRON HCL 4 MG/2ML IJ SOLN
4.0000 mg | Freq: Once | INTRAMUSCULAR | Status: AC
  Administered 2019-05-20: 4 mg via INTRAVENOUS
  Filled 2019-05-20: qty 2

## 2019-05-20 MED ORDER — OXYCODONE-ACETAMINOPHEN 10-325 MG PO TABS: 0.5000 | ORAL_TABLET | Freq: Four times a day (QID) | ORAL | 0 refills | Status: AC | PRN

## 2019-05-20 MED ORDER — HYDROMORPHONE HCL 1 MG/ML IJ SOLN
1.0000 mg | Freq: Once | INTRAMUSCULAR | Status: AC
  Administered 2019-05-20: 06:00:00 1 mg via INTRAVENOUS
  Filled 2019-05-20: qty 1

## 2019-05-20 MED ORDER — ONDANSETRON 8 MG PO TBDP: 8.0000 mg | ORAL_TABLET | Freq: Three times a day (TID) | ORAL | 0 refills | Status: AC | PRN

## 2019-05-20 NOTE — Telephone Encounter (Signed)
Pt has an MRI scheduled for tomorrow and requires some anti-anxiety medications.  I will send in some Ativan to his pharmacy.

## 2019-05-20 NOTE — ED Triage Notes (Signed)
Pt c/o left flank pain x 2 weeks. Pt also reports difficulty urinating

## 2019-05-20 NOTE — ED Provider Notes (Signed)
[facility] Provider Note: [name], MD, FACEP  CSN: [id number]: [id number] ARRIVAL: 05/20/19 at [place]: [place]  Flank Pain   HISTORY OF PRESENT ILLNESS  05/20/19 6:18 AM Harry Arnold is a 53 y.o. male with history of kidney stones.  He is here with 2 weeks of pain in his back, primarily the left flank radiating to his left lower quadrant.  He has also had difficulty urinating as well.  He states he has to force his urine out.  He has not noticed any blood in his urine.  His pain acutely worsened this morning and he rates it 10 out of 10 and characterizes it as like previous kidney stones.  It is somewhat worse with movement or palpation.  He has associated nausea but no vomiting.  He denies any numbness of the groin or numbness or weakness in his lower extremities.  He has had no change in bowel function.  He was seen at [facility] on 04/13/2019 and was diagnosed with acute low back pain.  A CT scan at that time showed no evidence of nephrolithiasis or ureterolithiasis and his urinalysis was negative.    Past Medical History:  Diagnosis Date  . 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 Chest pain 04/2016  . Diverticulitis   . GERD (gastroesophageal reflux disease)   . Hypertension   . Kidney stones     Past Surgical History:  Procedure Laterality Date  . CARDIAC CATHETERIZATION N/A 04/24/2016   Procedure: Left Heart Cath and Coronary Angiography;  Surgeon: Jettie Booze, MD;  Location: [place] [facility];  Service: Cardiovascular;  Laterality: N/A;  . KNEE ARTHROSCOPY    . testicular cancer      No family history on file.  Social History   Tobacco Use  . Smoking status: Never Smoker  . Smokeless tobacco: Never Used  Substance Use Topics  . Alcohol use: No  . Drug use: No    Prior to Admission medications   Not on File    Allergies Ibuprofen, Naproxen sodium, and Flexeril [cyclobenzaprine hcl]   REVIEW OF  SYSTEMS  Negative except as noted here or in the History of Present Illness.   PHYSICAL EXAMINATION  Initial Vital Signs Pulse 87, temperature 97.9 F (36.6 C), temperature source Oral, resp. rate 18, height 5\' 10"  (1.778 m), weight 95.3 kg, SpO2 99 %.  Examination General: Well-developed, well-nourished male in no acute distress; appearance consistent with age of record HENT: normocephalic; atraumatic Eyes: Normal appearance Neck: supple Heart: regular rate and rhythm Lungs: clear to auscultation bilaterally Abdomen: soft; nondistended; left lower quadrant tenderness; bowel sounds present Back: Left CVA tenderness; positive straight leg raise on the left at 60 degrees Extremities: No deformity; full range of motion; pulses normal Neurologic: Awake, alert and oriented; motor function intact in all extremities and symmetric; sensation intact and symmetric in lower extremities; no saddle anesthesia; no facial droop Skin: Warm and dry Psychiatric: Normal mood and affect   RESULTS  Summary of this visit's results, reviewed by myself:   EKG Interpretation  Date/Time:    Ventricular Rate:    PR Interval:    QRS Duration:   QT Interval:    QTC Calculation:   R Axis:     Text Interpretation:        Laboratory Studies: Results for orders placed or performed during the hospital encounter of 05/20/19 (from the past 24 hour(s))  Urinalysis, Routine  w reflex microscopic     Status: Abnormal   Collection Time: 05/20/19  6:19 AM  Result Value Ref Range   Color, Urine STRAW (A) YELLOW   APPearance CLEAR CLEAR   Specific Gravity, Urine 1.010 1.005 - 1.030   pH 7.5 5.0 - 8.0   Glucose, UA NEGATIVE NEGATIVE mg/dL   Hgb urine dipstick NEGATIVE NEGATIVE   Bilirubin Urine NEGATIVE NEGATIVE   Ketones, ur NEGATIVE NEGATIVE mg/dL   Protein, ur NEGATIVE NEGATIVE mg/dL   Nitrite NEGATIVE NEGATIVE   Leukocytes,Ua NEGATIVE NEGATIVE   Imaging Studies: No results found.  ED COURSE and  MDM  Nursing notes and initial vitals signs, including pulse oximetry, reviewed.  Vitals:   05/20/19 0613 05/20/19 0614  BP:  (!) 185/106  Pulse:  87  Resp:  18  Temp:  97.9 F (36.6 C)  TempSrc:  Oral  SpO2:  99%  Weight: 95.3 kg   Height: 5\' 10"  (1.778 m)    6:33 AM Pain relieved with 1 mg of Dilaudid IV.  His urinalysis is normal.  As noted above a CT scan about a month ago showed no evidence of nephrolithiasis or ureterolithiasis.  Given his difficulty voiding and pain reproducible with movement of the left leg I suspect a neurologic etiology.  Differential diagnosis includes cauda equina syndrome.  We will arrange for an MRI tomorrow to evaluate his lumbar spine.   PROCEDURES    ED DIAGNOSES     ICD-10-CM   1. Acute left flank pain  R10.9        Siddhant Hashemi, Jenny Reichmann, MD 05/20/19 [phone]

## 2019-05-21 ENCOUNTER — Ambulatory Visit (HOSPITAL_BASED_OUTPATIENT_CLINIC_OR_DEPARTMENT_OTHER)
Admission: RE | Admit: 2019-05-21 | Discharge: 2019-05-21 | Disposition: A | Discharge status: Home / Self Care | Payer: Self-pay | Source: Ambulatory Visit | Attending: Emergency Medicine | Admitting: Emergency Medicine

## 2019-05-21 DIAGNOSIS — M5136 Other intervertebral disc degeneration, lumbar region: Secondary | ICD-10-CM | POA: Insufficient documentation

## 2019-05-21 DIAGNOSIS — Z8546 Personal history of malignant neoplasm of prostate: Secondary | ICD-10-CM | POA: Insufficient documentation

## 2019-05-21 DIAGNOSIS — M5137 Other intervertebral disc degeneration, lumbosacral region: Secondary | ICD-10-CM | POA: Insufficient documentation

## 2019-09-18 IMAGING — MR MR LUMBAR SPINE W/O CM
5 series · 39 of 48 positions shown · non-contrast
Comparison: Lumbar radiographs dated 12/22/2015

CLINICAL DATA: Low back pain. Negative groin pain. Weakness in the
left leg.

EXAM:
MRI LUMBAR SPINE WITHOUT CONTRAST
TECHNIQUE: Multiplanar, multisequence MR imaging of the lumbar spine was
performed. No intravenous contrast was administered.

[Series 3: T2 · sagittal · 4.0mm · 1.02mm/px · 6 of 17 slices shown (1 of 2)]
[im 1/17]
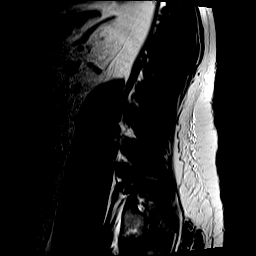
[im 4/17]
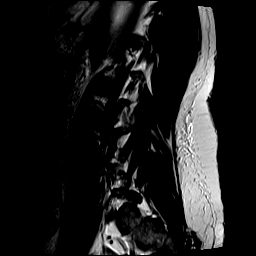
[im 7/17]
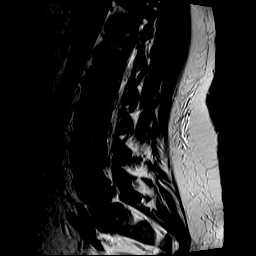
[im 10/17]
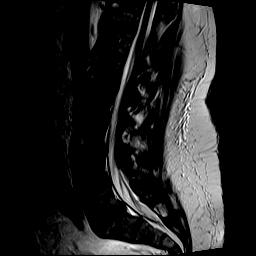
[im 13/17]
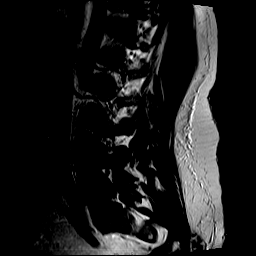
[im 17/17]
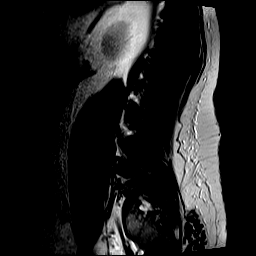

[Series 4: STIR · sagittal · 4.0mm · 1.02mm/px · 6 of 17 slices shown]
[im 1/17]
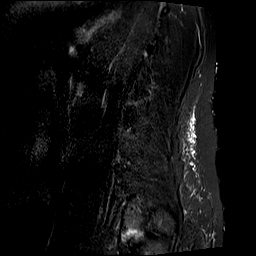
[im 4/17]
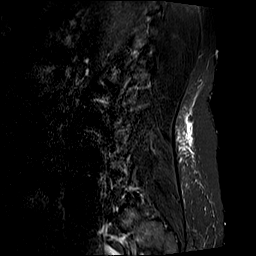
[im 7/17]
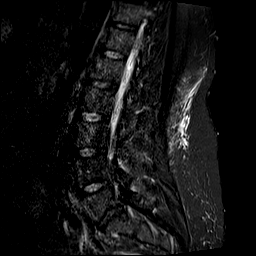
[im 10/17]
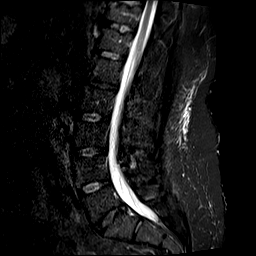
[im 13/17]
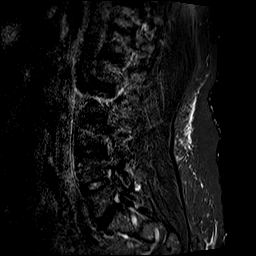
[im 17/17]
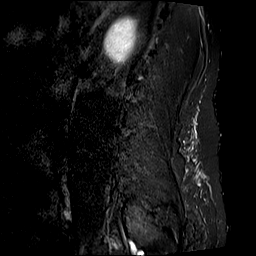

[Series 5: T1 · sagittal · 4.0mm · 1.02mm/px · 6 of 17 slices shown (1 of 2)]
[im 1/17]
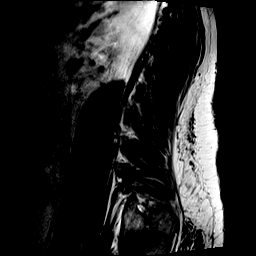
[im 4/17]
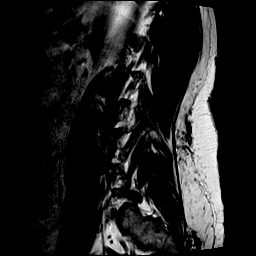
[im 7/17]
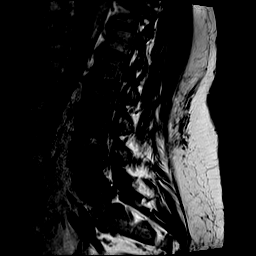
[im 10/17]
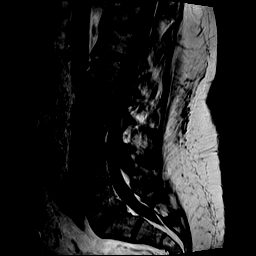
[im 13/17]
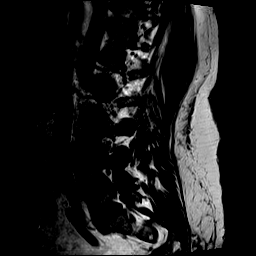
[im 17/17]
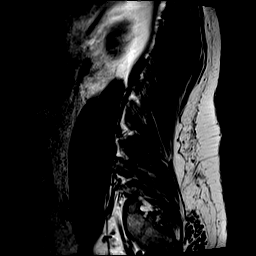

[Series 6: T2 · axial · 4.0mm · 0.62mm/px · z∈[-127,+106]mm · 12 of 42 slices shown (2 of 2)]
[im 1/42]
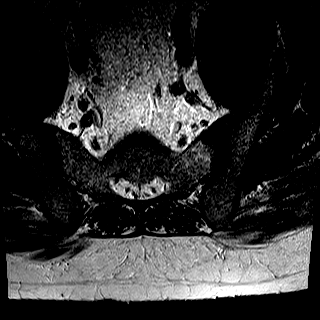
[im 3/42]
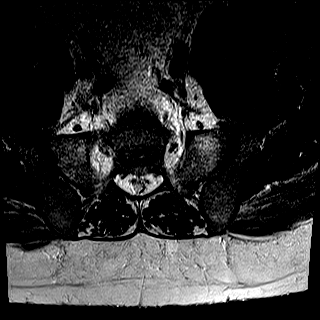
[im 6/42]
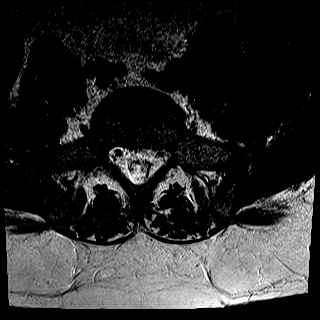
[im 9/42]
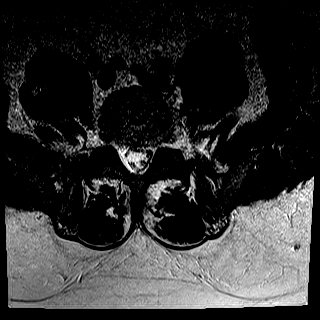
[im 12/42]
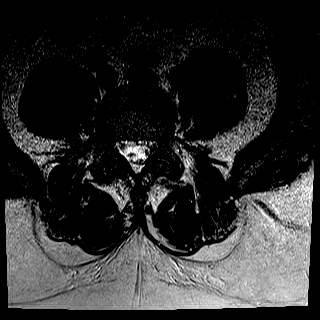
[im 15/42]
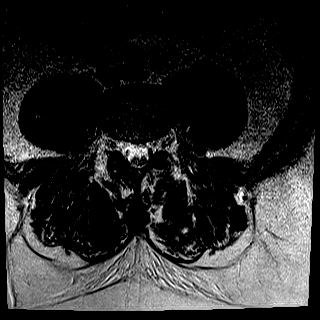
[im 18/42]
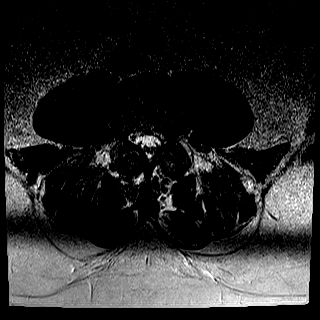
[im 21/42]
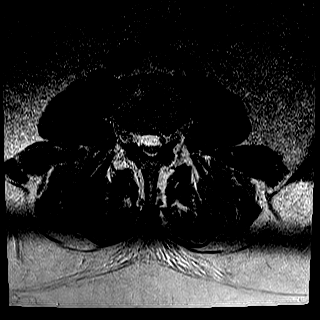
[im 24/42]
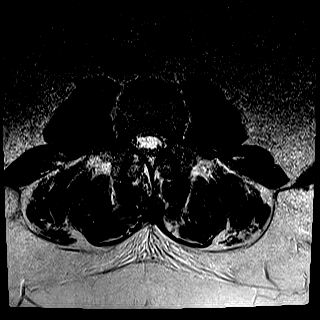
[im 30/42]
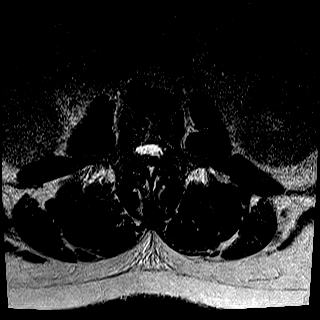
[im 36/42]
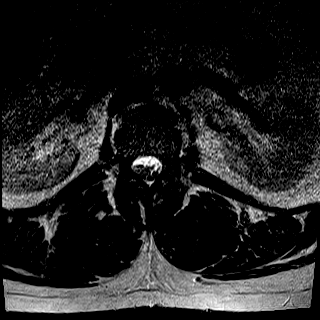
[im 42/42]
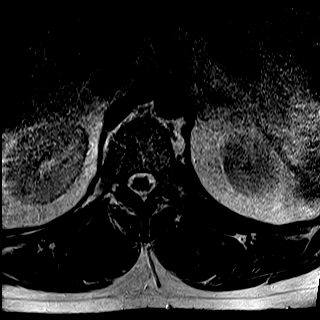

[Series 8: T1 · axial · 4.0mm · 0.39mm/px · z∈[-127,+106]mm · 9 of 42 slices shown (2 of 2)]
[im 1/42]
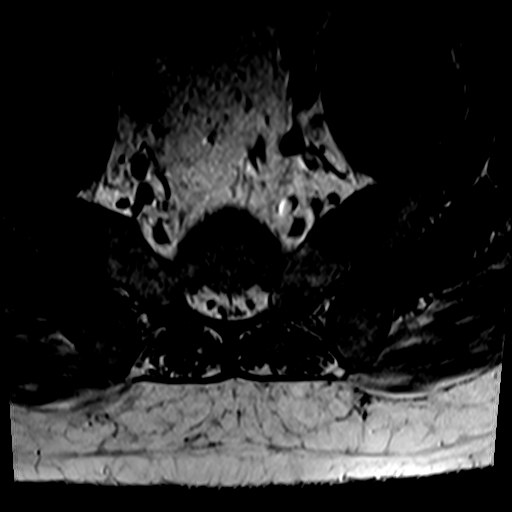
[im 6/42]
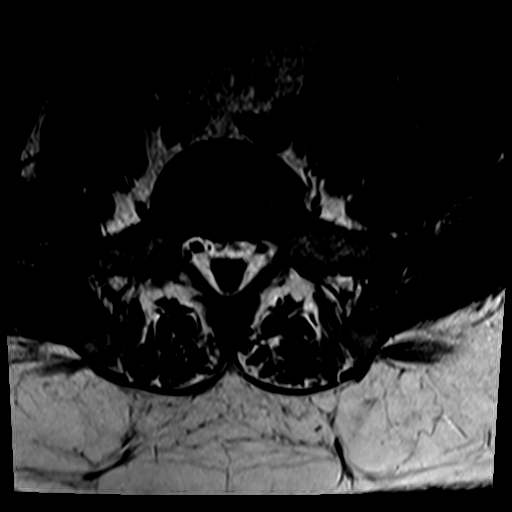
[im 12/42]
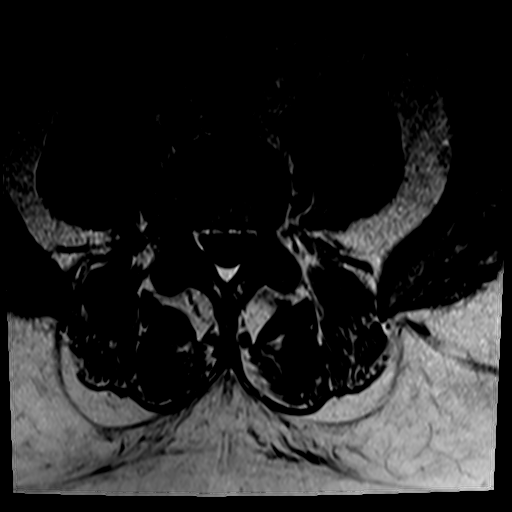
[im 18/42]
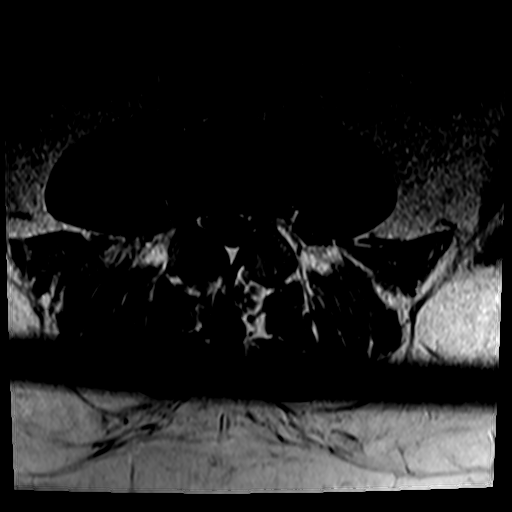
[im 21/42]
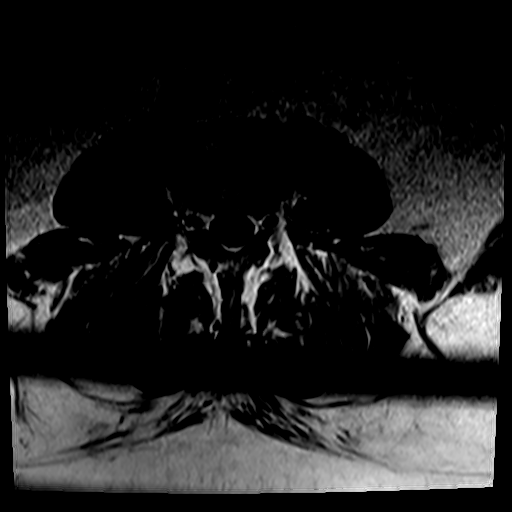
[im 24/42]
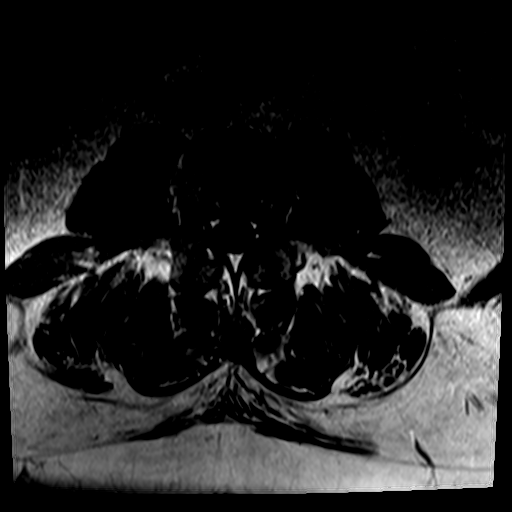
[im 30/42]
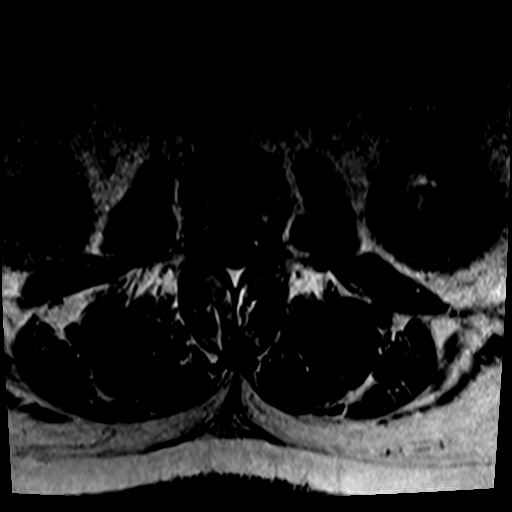
[im 36/42]
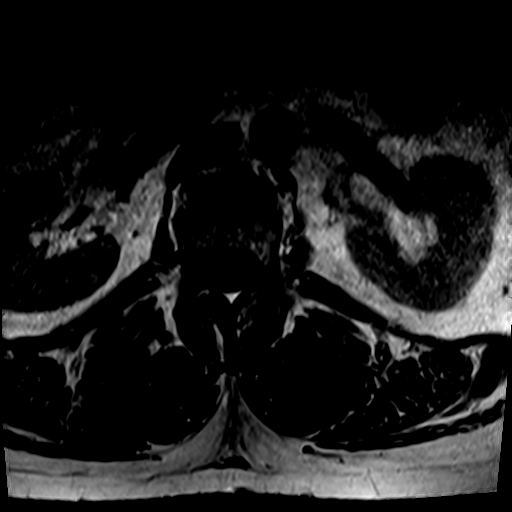
[im 42/42]
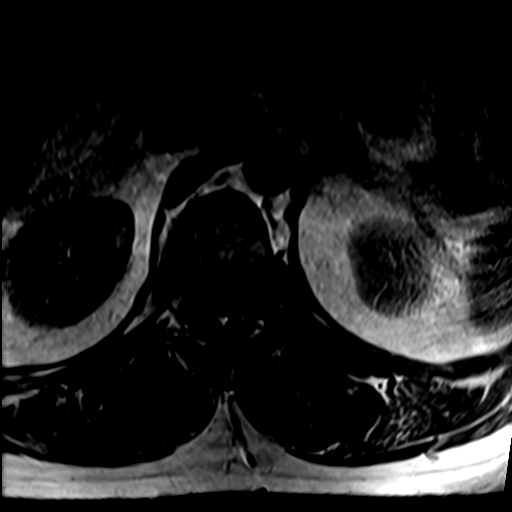

[39 of 48 positions shown; findings below may reference images not displayed]

FINDINGS: Segmentation:  Standard.

Alignment:  Physiologic.

Vertebrae: No fracture, evidence of discitis, or significant bone
lesion.

Conus medullaris and cauda equina: Conus extends to the L1 level.
Conus and cauda equina appear normal.

Paraspinal and other soft tissues: Negative.

Disc levels:

T12-L1: Negative.

L1-2: Negative.

L2-3: Negative.

L3-4: Negative.

L4-5: Normal disc.  Slight bilateral facet arthritis.

L5-S1: Small central subligamentous disc protrusion without neural
impingement. No foraminal stenosis. No facet arthritis.
IMPRESSION: 1. Degenerative disc disease at L5-S1 with a small subligamentous
disc protrusion without neural impingement.
2. Slight degenerative facet arthritis at L4-5.
